# Patient Record
Sex: Female | Born: 1940 | Race: Black or African American | Hispanic: No | Smoking: Never smoker
Health system: Southern US, Community
[De-identification: ages and names within clinical notes are randomized; demographics above are authoritative.]

## PROBLEM LIST (undated history)

## (undated) DIAGNOSIS — K449 Diaphragmatic hernia without obstruction or gangrene: Secondary | ICD-10-CM

## (undated) DIAGNOSIS — I2510 Atherosclerotic heart disease of native coronary artery without angina pectoris: Secondary | ICD-10-CM

## (undated) DIAGNOSIS — N189 Chronic kidney disease, unspecified: Secondary | ICD-10-CM

## (undated) DIAGNOSIS — M109 Gout, unspecified: Secondary | ICD-10-CM

## (undated) DIAGNOSIS — I10 Essential (primary) hypertension: Secondary | ICD-10-CM

## (undated) DIAGNOSIS — M1990 Unspecified osteoarthritis, unspecified site: Secondary | ICD-10-CM

## (undated) HISTORY — DX: Chronic kidney disease, unspecified: N18.9

## (undated) HISTORY — PX: CHOLECYSTECTOMY: SHX55

## (undated) HISTORY — PX: CARDIAC CATHETERIZATION: SHX172

## (undated) HISTORY — PX: APPENDECTOMY: SHX54

## (undated) HISTORY — PX: ABDOMINAL HYSTERECTOMY: SHX81

## (undated) HISTORY — PX: FRACTURE SURGERY: SHX138

---

## 1998-02-25 ENCOUNTER — Emergency Department (HOSPITAL_COMMUNITY): Admission: EM | Admit: 1998-02-25 | Discharge: 1998-02-25 | Payer: Self-pay | Admitting: Emergency Medicine

## 1999-06-19 ENCOUNTER — Ambulatory Visit (HOSPITAL_COMMUNITY): Admission: RE | Admit: 1999-06-19 | Discharge: 1999-06-19 | Payer: Self-pay | Admitting: Cardiology

## 1999-06-19 ENCOUNTER — Encounter: Payer: Self-pay | Admitting: Cardiology

## 1999-11-13 ENCOUNTER — Encounter: Payer: Self-pay | Admitting: Emergency Medicine

## 1999-11-13 ENCOUNTER — Inpatient Hospital Stay (HOSPITAL_COMMUNITY): Admission: EM | Admit: 1999-11-13 | Discharge: 1999-11-14 | Payer: Self-pay | Admitting: Emergency Medicine

## 1999-11-13 ENCOUNTER — Encounter: Payer: Self-pay | Admitting: Cardiovascular Disease

## 2000-07-10 ENCOUNTER — Encounter: Payer: Self-pay | Admitting: Cardiology

## 2000-07-10 ENCOUNTER — Ambulatory Visit (HOSPITAL_COMMUNITY): Admission: RE | Admit: 2000-07-10 | Discharge: 2000-07-10 | Payer: Self-pay | Admitting: Cardiology

## 2001-08-03 ENCOUNTER — Ambulatory Visit (HOSPITAL_COMMUNITY): Admission: RE | Admit: 2001-08-03 | Discharge: 2001-08-04 | Payer: Self-pay | Admitting: Cardiology

## 2001-08-04 ENCOUNTER — Encounter: Payer: Self-pay | Admitting: Cardiology

## 2001-08-06 ENCOUNTER — Encounter: Payer: Self-pay | Admitting: Cardiology

## 2001-08-06 ENCOUNTER — Ambulatory Visit (HOSPITAL_COMMUNITY): Admission: RE | Admit: 2001-08-06 | Discharge: 2001-08-06 | Payer: Self-pay | Admitting: Cardiology

## 2001-09-29 ENCOUNTER — Inpatient Hospital Stay (HOSPITAL_COMMUNITY): Admission: EM | Admit: 2001-09-29 | Discharge: 2001-09-30 | Payer: Self-pay

## 2002-02-10 ENCOUNTER — Encounter: Payer: Self-pay | Admitting: Cardiology

## 2002-02-10 ENCOUNTER — Ambulatory Visit (HOSPITAL_COMMUNITY): Admission: RE | Admit: 2002-02-10 | Discharge: 2002-02-10 | Payer: Self-pay | Admitting: Cardiology

## 2002-02-11 ENCOUNTER — Encounter: Payer: Self-pay | Admitting: Cardiology

## 2002-02-15 ENCOUNTER — Encounter (HOSPITAL_BASED_OUTPATIENT_CLINIC_OR_DEPARTMENT_OTHER): Payer: Self-pay | Admitting: General Surgery

## 2002-02-17 ENCOUNTER — Encounter (INDEPENDENT_AMBULATORY_CARE_PROVIDER_SITE_OTHER): Payer: Self-pay | Admitting: Specialist

## 2002-02-17 ENCOUNTER — Ambulatory Visit (HOSPITAL_COMMUNITY): Admission: RE | Admit: 2002-02-17 | Discharge: 2002-02-17 | Payer: Self-pay | Admitting: General Surgery

## 2004-04-20 ENCOUNTER — Encounter: Admission: RE | Admit: 2004-04-20 | Discharge: 2004-04-20 | Payer: Self-pay | Admitting: Cardiology

## 2004-06-20 ENCOUNTER — Emergency Department (HOSPITAL_COMMUNITY): Admission: EM | Admit: 2004-06-20 | Discharge: 2004-06-20 | Payer: Self-pay | Admitting: Family Medicine

## 2005-04-02 ENCOUNTER — Emergency Department (HOSPITAL_COMMUNITY): Admission: EM | Admit: 2005-04-02 | Discharge: 2005-04-02 | Payer: Self-pay | Admitting: Family Medicine

## 2007-01-28 ENCOUNTER — Inpatient Hospital Stay (HOSPITAL_COMMUNITY): Admission: EM | Admit: 2007-01-28 | Discharge: 2007-01-30 | Payer: Self-pay | Admitting: Emergency Medicine

## 2007-01-31 ENCOUNTER — Inpatient Hospital Stay (HOSPITAL_COMMUNITY): Admission: EM | Admit: 2007-01-31 | Discharge: 2007-02-06 | Payer: Self-pay | Admitting: Emergency Medicine

## 2007-02-04 ENCOUNTER — Ambulatory Visit: Payer: Self-pay | Admitting: Physical Medicine & Rehabilitation

## 2007-02-06 ENCOUNTER — Inpatient Hospital Stay (HOSPITAL_COMMUNITY)
Admission: RE | Admit: 2007-02-06 | Discharge: 2007-02-16 | Payer: Self-pay | Admitting: Physical Medicine & Rehabilitation

## 2007-03-09 ENCOUNTER — Ambulatory Visit (HOSPITAL_COMMUNITY): Admission: RE | Admit: 2007-03-09 | Discharge: 2007-03-09 | Payer: Self-pay | Admitting: Cardiology

## 2007-04-02 ENCOUNTER — Encounter: Admission: RE | Admit: 2007-04-02 | Discharge: 2007-06-18 | Payer: Self-pay | Admitting: Orthopedic Surgery

## 2007-05-13 ENCOUNTER — Encounter: Admission: RE | Admit: 2007-05-13 | Discharge: 2007-08-05 | Payer: Self-pay | Admitting: General Surgery

## 2008-06-20 ENCOUNTER — Ambulatory Visit (HOSPITAL_COMMUNITY): Admission: RE | Admit: 2008-06-20 | Discharge: 2008-06-20 | Payer: Self-pay | Admitting: Cardiology

## 2008-09-30 ENCOUNTER — Emergency Department (HOSPITAL_COMMUNITY): Admission: EM | Admit: 2008-09-30 | Discharge: 2008-09-30 | Payer: Self-pay | Admitting: Emergency Medicine

## 2009-03-22 ENCOUNTER — Emergency Department (HOSPITAL_COMMUNITY): Admission: EM | Admit: 2009-03-22 | Discharge: 2009-03-22 | Payer: Self-pay | Admitting: Family Medicine

## 2009-09-17 ENCOUNTER — Emergency Department (HOSPITAL_COMMUNITY): Admission: EM | Admit: 2009-09-17 | Discharge: 2009-09-17 | Payer: Self-pay | Admitting: Family Medicine

## 2009-11-13 ENCOUNTER — Ambulatory Visit (HOSPITAL_COMMUNITY): Admission: RE | Admit: 2009-11-13 | Discharge: 2009-11-13 | Payer: Self-pay | Admitting: Cardiology

## 2009-12-07 ENCOUNTER — Ambulatory Visit (HOSPITAL_COMMUNITY): Admission: RE | Admit: 2009-12-07 | Discharge: 2009-12-07 | Payer: Self-pay | Admitting: Cardiology

## 2010-09-16 ENCOUNTER — Encounter: Payer: Self-pay | Admitting: Cardiology

## 2010-10-19 ENCOUNTER — Other Ambulatory Visit (HOSPITAL_COMMUNITY): Payer: Self-pay | Admitting: Cardiology

## 2010-10-19 DIAGNOSIS — I208 Other forms of angina pectoris: Secondary | ICD-10-CM

## 2010-10-27 ENCOUNTER — Emergency Department (HOSPITAL_COMMUNITY): Payer: Medicare Other

## 2010-10-27 ENCOUNTER — Inpatient Hospital Stay (HOSPITAL_COMMUNITY)
Admission: EM | Admit: 2010-10-27 | Discharge: 2010-10-31 | DRG: 313 | Disposition: A | Discharge status: Home Health | Payer: Medicare Other | Attending: Internal Medicine | Admitting: Internal Medicine

## 2010-10-27 DIAGNOSIS — I2510 Atherosclerotic heart disease of native coronary artery without angina pectoris: Secondary | ICD-10-CM | POA: Diagnosis present

## 2010-10-27 DIAGNOSIS — K219 Gastro-esophageal reflux disease without esophagitis: Secondary | ICD-10-CM | POA: Diagnosis present

## 2010-10-27 DIAGNOSIS — R0789 Other chest pain: Principal | ICD-10-CM | POA: Diagnosis present

## 2010-10-27 DIAGNOSIS — M1990 Unspecified osteoarthritis, unspecified site: Secondary | ICD-10-CM | POA: Diagnosis present

## 2010-10-27 DIAGNOSIS — Z79899 Other long term (current) drug therapy: Secondary | ICD-10-CM

## 2010-10-27 DIAGNOSIS — I10 Essential (primary) hypertension: Secondary | ICD-10-CM | POA: Diagnosis present

## 2010-10-27 DIAGNOSIS — I252 Old myocardial infarction: Secondary | ICD-10-CM

## 2010-10-27 DIAGNOSIS — E785 Hyperlipidemia, unspecified: Secondary | ICD-10-CM | POA: Diagnosis present

## 2010-10-27 DIAGNOSIS — Z7982 Long term (current) use of aspirin: Secondary | ICD-10-CM

## 2010-10-27 DIAGNOSIS — Z8249 Family history of ischemic heart disease and other diseases of the circulatory system: Secondary | ICD-10-CM

## 2010-10-27 DIAGNOSIS — F341 Dysthymic disorder: Secondary | ICD-10-CM | POA: Diagnosis present

## 2010-10-27 DIAGNOSIS — E669 Obesity, unspecified: Secondary | ICD-10-CM | POA: Diagnosis present

## 2010-10-27 DIAGNOSIS — M109 Gout, unspecified: Secondary | ICD-10-CM | POA: Diagnosis present

## 2010-10-27 LAB — LIPID PANEL
Cholesterol: 175 mg/dL (ref 0–200)
LDL Cholesterol: 106 mg/dL — ABNORMAL HIGH (ref 0–99)
Triglycerides: 73 mg/dL (ref <150)

## 2010-10-27 LAB — CBC
HCT: 37 % (ref 36.0–46.0)
HCT: 35.7 % — ABNORMAL LOW (ref 36.0–46.0)
Hemoglobin: 12.4 g/dL (ref 12.0–15.0)
MCV: 87.3 fL (ref 78.0–100.0)
Platelets: 220 10*3/uL (ref 150–400)
RBC: 4.24 MIL/uL (ref 3.87–5.11)
RBC: 4.1 MIL/uL (ref 3.87–5.11)
RDW: 15.5 % (ref 11.5–15.5)
RDW: 15.5 % (ref 11.5–15.5)
WBC: 7.6 10*3/uL (ref 4.0–10.5)
WBC: 7.8 10*3/uL (ref 4.0–10.5)

## 2010-10-27 LAB — HEMOGLOBIN A1C
Hgb A1c MFr Bld: 5.9 % — ABNORMAL HIGH (ref <5.7)
Mean Plasma Glucose: 123 mg/dL — ABNORMAL HIGH (ref <117)

## 2010-10-27 LAB — CARDIAC PANEL(CRET KIN+CKTOT+MB+TROPI)
CK, MB: 4.9 ng/mL — ABNORMAL HIGH (ref 0.3–4.0)
Relative Index: 1 (ref 0.0–2.5)
Total CK: 471 U/L — ABNORMAL HIGH (ref 7–177)

## 2010-10-27 LAB — BASIC METABOLIC PANEL
Calcium: 9.4 mg/dL (ref 8.4–10.5)
GFR calc non Af Amer: 51 mL/min — ABNORMAL LOW (ref >60)
Glucose, Bld: 127 mg/dL — ABNORMAL HIGH (ref 70–99)
Sodium: 141 mEq/L (ref 135–145)

## 2010-10-27 LAB — POCT CARDIAC MARKERS
CKMB, poc: 1.8 ng/mL (ref 1.0–8.0)
Myoglobin, poc: 233 ng/mL (ref 12–200)
Troponin i, poc: <0.05 ng/mL (ref 0.00–0.09)

## 2010-10-27 LAB — DIFFERENTIAL
Basophils Absolute: 0 10*3/uL (ref 0.0–0.1)
Basophils Absolute: 0 10*3/uL (ref 0.0–0.1)
Eosinophils Absolute: 0.2 10*3/uL (ref 0.0–0.7)
Eosinophils Relative: 3 % (ref 0–5)
Eosinophils Relative: 2 % (ref 0–5)
Lymphocytes Relative: 43 % (ref 12–46)
Lymphocytes Relative: 45 % (ref 12–46)
Lymphs Abs: 3.3 10*3/uL (ref 0.7–4.0)
Neutro Abs: 3.5 10*3/uL (ref 1.7–7.7)
Neutrophils Relative %: 46 % (ref 43–77)
Neutrophils Relative %: 46 % (ref 43–77)

## 2010-10-27 LAB — CK TOTAL AND CKMB (NOT AT ARMC)
CK, MB: 4.8 ng/mL — ABNORMAL HIGH (ref 0.3–4.0)
Relative Index: 0.9 (ref 0.0–2.5)

## 2010-10-27 LAB — COMPREHENSIVE METABOLIC PANEL
Albumin: 3.6 g/dL (ref 3.5–5.2)
BUN: 21 mg/dL (ref 6–23)
Creatinine, Ser: 1.07 mg/dL (ref 0.4–1.2)
Total Protein: 6.9 g/dL (ref 6.0–8.3)

## 2010-10-27 LAB — TROPONIN I: Troponin I: 0.01 ng/mL (ref 0.00–0.06)

## 2010-10-27 LAB — PHOSPHORUS: Phosphorus: 3.8 mg/dL (ref 2.3–4.6)
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
## 2010-10-29 LAB — BASIC METABOLIC PANEL
BUN: 17 mg/dL (ref 6–23)
CO2: 28 mEq/L (ref 19–32)
Chloride: 108 mEq/L (ref 96–112)
Creatinine, Ser: 1.05 mg/dL (ref 0.4–1.2)

## 2010-10-29 LAB — CBC
Hemoglobin: 12 g/dL (ref 12.0–15.0)
MCH: 28.6 pg (ref 26.0–34.0)
MCV: 86.9 fL (ref 78.0–100.0)
RBC: 4.19 MIL/uL (ref 3.87–5.11)

## 2010-10-30 ENCOUNTER — Inpatient Hospital Stay (HOSPITAL_COMMUNITY): Payer: Medicare Other

## 2010-10-30 DIAGNOSIS — R079 Chest pain, unspecified: Secondary | ICD-10-CM

## 2010-10-30 MED ORDER — TECHNETIUM TC 99M TETROFOSMIN IV KIT
10.0000 | PACK | Freq: Once | INTRAVENOUS | Status: AC | PRN
  Administered 2010-10-30: 10 via INTRAVENOUS

## 2010-10-30 MED ORDER — TECHNETIUM TC 99M TETROFOSMIN IV KIT
30.0000 | PACK | Freq: Once | INTRAVENOUS | Status: AC | PRN
  Administered 2010-10-30: 30 via INTRAVENOUS

## 2010-10-31 ENCOUNTER — Inpatient Hospital Stay (HOSPITAL_COMMUNITY): Payer: Medicare Other

## 2010-11-12 ENCOUNTER — Other Ambulatory Visit (HOSPITAL_COMMUNITY): Payer: Self-pay

## 2010-11-19 ENCOUNTER — Other Ambulatory Visit (HOSPITAL_COMMUNITY): Payer: Self-pay | Admitting: Cardiology

## 2010-11-19 DIAGNOSIS — Z1231 Encounter for screening mammogram for malignant neoplasm of breast: Secondary | ICD-10-CM

## 2010-11-27 ENCOUNTER — Ambulatory Visit (HOSPITAL_COMMUNITY)
Admission: RE | Admit: 2010-11-27 | Discharge: 2010-11-27 | Disposition: A | Discharge status: Home / Self Care | Payer: Medicare Other | Source: Ambulatory Visit | Attending: Cardiology | Admitting: Cardiology

## 2010-11-27 DIAGNOSIS — Z1231 Encounter for screening mammogram for malignant neoplasm of breast: Secondary | ICD-10-CM | POA: Insufficient documentation

## 2010-12-02 LAB — DIFFERENTIAL
Basophils Relative: 1 % (ref 0–1)
Eosinophils Absolute: 0.1 10*3/uL (ref 0.0–0.7)
Eosinophils Relative: 3 % (ref 0–5)
Monocytes Relative: 9 % (ref 3–12)
Neutrophils Relative %: 50 % (ref 43–77)

## 2010-12-02 LAB — CBC
HCT: 35.6 % — ABNORMAL LOW (ref 36.0–46.0)
MCHC: 33.3 g/dL (ref 30.0–36.0)
MCV: 89.6 fL (ref 78.0–100.0)
RBC: 3.97 MIL/uL (ref 3.87–5.11)

## 2010-12-11 LAB — BASIC METABOLIC PANEL
BUN: 17 mg/dL (ref 6–23)
CO2: 25 mEq/L (ref 19–32)
Calcium: 9.4 mg/dL (ref 8.4–10.5)
Creatinine, Ser: 0.99 mg/dL (ref 0.4–1.2)
GFR calc Af Amer: >60 mL/min (ref >60)
Glucose, Bld: 101 mg/dL — ABNORMAL HIGH (ref 70–99)

## 2010-12-11 LAB — POCT CARDIAC MARKERS: Myoglobin, poc: 323 ng/mL (ref 12–200)

## 2010-12-11 LAB — CBC
MCHC: 33.2 g/dL (ref 30.0–36.0)
MCV: 89.2 fL (ref 78.0–100.0)
Platelets: 171 10*3/uL (ref 150–400)
RBC: 4.5 MIL/uL (ref 3.87–5.11)
RDW: 14.8 % (ref 11.5–15.5)
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
## 2011-01-08 NOTE — Discharge Summary (Signed)
NAMEJODEY, Shelby Meza              ACCOUNT NO.:  [id number]   MEDICAL RECORD NO.:  [id number]          PATIENT TYPE:  INP   LOCATION:  [id number]                         FACILITY:  [facility]   PHYSICIAN:  Mohan N. Terrence Dupont, M.D. DATE OF BIRTH:  1941-07-11   DATE OF ADMISSION:  01/28/2007  DATE OF DISCHARGE:  01/30/2007                               DISCHARGE SUMMARY   ADMITTING DIAGNOSES:  1. Chest pain.  Rule out myocardial infarction.  2. Hypertension.  3. Degenerative joint disease.  4. Morbid obesity.  5. Gastroesophageal reflux disease.  6. Hypercholesterolemia.   FINAL DIAGNOSES:  1. Status post chest pain.  Myocardial infarction ruled out.  Negative      Persantine Myoview.  2. Hypertension.  3. Hypercholesterolemia.  4. Gastroesophageal reflux disease.  5. Morbid obesity.  6. Degenerative joint disease.  7. Questionable __________ .  8. Anemia, stable.   DISCHARGE HOME MEDICATIONS:  Enteric coated aspirin 81 mg one tablet  daily, Norvasc 5 mg one tablet daily, __________  tablet daily at night,  Prilosec 20 mg one tablet daily.   DIET:  Low salt, low cholesterol.   ACTIVITY:  As tolerated.   CONDITION ON DISCHARGE:  Stable.   FOLLOW UP:  Follow up with me in one week.  Patient will be scheduled  for bilateral mammograms in one week as an outpatient.   BRIEF HISTORY:  Positive for __________ .  Denies any shortness of  breath.  Denies palpitations.  Denies __________.  Denies cough or sore  throat.   PAST MEDICAL HISTORY:  As above.   PAST SURGICAL HISTORY:  __________ .   ALLERGIES:  NO KNOWN DRUG ALLERGIES.   __________   LABORATORY DATA:  At the point of care, CK-MB were normal at 2.3 and  2.8, troponin I was 0.06 and 0.05.  Three sets of cardiac enzymes were  normal.  CK 558, MB 3.8.  Second set of CK 560, MB 4.4.  Third set CK  476, MB 3.6.  Troponin I was 0.02 x3.  Sodium was 141, potassium 3.8,  chloride 108, bicarb 27, glucose 97, BUN 35, creatinine 1.   Hemoglobin  was 10.7, hematocrit 37.9, white count of 5.6.   BRIEF HOSPITAL COURSE:  The patient was admitted to telemetry unit.  MI  was ruled out with serial enzymes and EKG.  Patient did not have any  episodes of chest pain during her hospital stay.  Patient subsequently  underwent Persantine Myoview which showed no evidence of reversible  ischemia with an EF of 50%.  There was questionable increased activity  in the right breast tissue.  Patient states she had mammograms in the  past, which were normal.  We will check for old records and repeat the  mammogram as an outpatient as indicated.  The patient will be discharged  home on the above medications and will be followed up in my office in  one week.           ______________________________  Allegra Lai. Terrence Dupont, M.D.     MNH/MEDQ  D:  01/30/2007  T:  01/30/2007  Job:  [id number] 

## 2011-01-11 NOTE — Op Note (Signed)
[place]. [facility]  Patient:    Shelby Meza, Shelby Meza Visit Number: [id number] MRN: [id number]          Service Type: DSU Location: [place] 2899 22 Attending Physician:  Sherolyn Buba Dictated by:   Candee Furbish Bubba Camp, M.D. Proc. Date: 02/17/02 Admit Date:  02/17/2002 Discharge Date: 02/17/2002                             Operative Report  PREOPERATIVE DIAGNOSIS:  Large epidermoid inclusion cyst of back.  POSTOPERATIVE DIAGNOSIS:  Large epidermoid inclusion cyst of back.  PROCEDURE:  Excision of epidermoid inclusion cyst of back.  SURGEON:  Candee Furbish. Bubba Camp, M.D.  ASSESSMENT:  Nurse.  ANESTHESIA:  General.  CLINICAL NOTE:  The patient presents with a very large, approximately 8 cm sebaceous cyst in the upper back subscapular region just to the right paraspinal area.  This has been infected on and off, and she now desires excision.  She comes to the operating room after the risks and benefits of surgery have been fully discussed, all questions answered, and consent obtained.  DESCRIPTION OF PROCEDURE:  Following the induction of satisfactory general anesthesia, with the patient positioned supinely, the patient was then turned into the prone position and positioned for surgery.  The region of the back was prepped and draped to be included in the sterile operative field.  I made a very large elliptical incision, extending approximately 12 cm in total length, deepening this through the skin and subcutaneous tissues, dissecting around the sebaceous cyst, carrying the dissection down to the muscles of the back.  The cyst then dissected free from the muscles and removed in its entirety and forwarded for pathologic evaluation.  Hemostasis was then meticulously obtained with electrocautery.  Sponge, instrument, and sharp counts verified.  Lateral flaps were raised so as to effect a tension-free closure.  The subcutaneous tissue was then closed with  interrupted and running sutures of 4-0 Vicryl.  The skin was closed with a 4-0 running Monocryl and then reinforced with Steri-Strips and a sterile compressive dressing applied. Anesthetic reversed, the patient removed from the operating room to the recovery room in stable condition.  She tolerated the procedure well. Dictated by:   Candee Furbish. Bubba Camp, M.D. Attending Physician:  Sherolyn Buba DD:  02/17/02 TD:  02/18/02 Job: [id number]

## 2011-01-11 NOTE — Cardiovascular Report (Signed)
[place]. [facility]  Patient:    Shelby Meza, Shelby Meza                     MRN: [id number] Proc. Date: 11/13/99 Adm. Date:  [id number] Attending:  Birdie Riddle CC:         Ardyth Gal. Spruill, M.D.             Cardiac Catheterization Laboratory                        Cardiac Catheterization  CINE NUMBER:  [id number]  PROCEDURE:  Left heart catheterization, selective coronary angiography, left ventricular function study, and descending aortography.  INDICATIONS:  This 69 year old black female had typical angina with elevated cardiac enzymes and cardiac risk factor of hypertension and a strong family history of coronary artery disease.  APPROACH:  Right femoral artery using 6 French diagnostic catheters.  A Perclose suture was applied at the end of the procedure.  COMPLICATIONS:  None.  HEMODYNAMIC DATA:  The left ventricular pressure was 131/15 mmHg and the aortic  pressure was 132/78 mmHg  LEFT VENTRICULOGRAM:  The left ventriculogram showed normal left ventricular systolic function with ejection fraction of 70%.  DESCENDING AORTOGRAPHY:  The descending aortography showed mild tortuosity of common iliac vessels and two left renal arteries and one right renal artery otherwise was unremarkable.  CORONARY ANATOMY:  Left main:  The left main coronary artery was essentially unremarkable.  Left left anterior descending:  Left anterior descending coronary artery was also unremarkable.  Left circumflex:  The left circumflex coronary artery was a large vessel and was codominant with right coronary artery.  The obtuse marginal branch was a large vessel.  Right coronary artery:  The right coronary artery was also a large vessel and was essentially unremarkable.  IMPRESSION: 1. Normal coronaries. 2. Normal left ventricular systolic function. 3. Mild tortuosity of distal aorta and left common iliac vessel.  RECOMMENDATIONS:  This patient will undergo  noncardiac chest pain work-up. DD:  11/13/99 TD:  11/13/99 Job: [id number]

## 2011-01-11 NOTE — Discharge Summary (Signed)
[place]. [facility]  Patient:    Shelby Meza, Shelby Meza                     MRN: [id number] Adm. Date:  [id number] Disc. Date: 11/14/99 Attending:  Birdie Riddle CC:         Ardyth Gal. Spruill, M.D.                           Discharge Summary  PRINCIPAL DIAGNOSES:  1. Chest pain.  2. Hiatal hernia.  3. Reflux esophagitis.  4. Hypertension.  5. Obesity.  PRINCIPAL PROCEDURE:  1. Left heart catheterization, selective coronary angiography, left left renal     function study and descending aortography done by Dr. Dixie Dials on November 13, 1999.  2. Upper GI series with radium swallow revealing small hiatal hernia and reflux     esophagitis done on November 13, 1999.  DISCHARGE MEDICATIONS:  1. ___________ recess program medication 2 capsules in the morning.  2. Celebrex 200 mg in the morning.  3. Doxepin 10-20 mg at bedtime.  4. Darvocet-N 100 one 4 times daily as needed.  5. Flexeril 10 mg twice daily.  6. Prevacid 30 mg 1 daily.  DISCHARGE ACTIVITY:  As tolerated.  DISCHARGE DIET:  Low fat, low salt, 1400 calorie diet.  WOUND CARE INSTRUCTIONS:  The patient is to notify doctor if she has right groin pain, swelling, or discharge.  FOLLOWUP:  Follow-up appointment by Dr. Dixie Dials in two weeks.  The patient to call [phone] for appointment.  Dr. Awanda Mink Spruill in four weeks.  The patient o arrange that also.  HISTORY:  This 70 year old black female had typical angina with elevated CK and MB. History of hypertension for 5 years, obesity, and a family history of premature  coronary artery disease with myocardial infarction to father at age 70.  PHYSICAL EXAMINATION:  VITAL SIGNS:  Temperature 97, pulse 80, respirations 16, blood pressure 132/80.  Height 5 feet 5 inches.  Weight 265 pounds.  GENERAL:  The patient is alert and oriented x 3.  HEENT:  The head is normocephalic, atraumatic with gray and black hair. Eyes:  Brown eyes with  pupils equal and reacting to light.  Extraocular movements are intact.  Conjunctivae pink, sclerae nonicteric.  Ears, nose and throat: Mucous membranes pink and moist.  NECK:  No JVD.  No carotid bruit.  LUNGS:  Clear bilaterally.  HEART:  Normal S1 and S2.  ABDOMEN:  Soft and nontender, but distended.  EXTREMITIES:  No edema, cyanosis, or clubbing.  CENTRAL NERVOUS SYSTEM:  Cranial nerves II-XII grossly intact.  LABORATORY DATA:  Normal hemoglobin, hematocrit, WBC count and platelet count.  Normal electrolytes, BUN, and creatinine.  CK elevated at 679.  MB elevated at .8. Troponin I negative.  EKG with sinus rhythm.  Left heart catheterization with selective coronary angiography and left renal function study and descending aortography that showed normal coronaries and normal LV systolic function.  Mild tortuosity of the distal aorta.  Barium swallow with upper GI series revealed a small hiatal hernia with reflux esophagitis.  HOSPITAL COURSE:  The patient was admitted to telemetry unit with elevated CK-MBs and total CK, and typical chest pain present.  Underwent cardiac catheterization, however, this showed normal coronaries.  Hence, the patient underwent upper GI series studies revealing hiatal hernia and reflux esophagitis.  Heart condition  remained stable in hospital and  she was placed on Prevacid 30 mg one daily. She was discharged in satisfactory condition with followup by Dr. Wallene Huh in  four weeks and by me in two weeks. DD:  11/14/99 TD:  11/14/99 Job: [id number]

## 2011-01-11 NOTE — Discharge Summary (Signed)
[place]. [facility]  Patient:    Shelby Meza, Shelby Meza Visit Number: [id number] MRN: [id number]          Service Type: MED Location: 2000 2028 01 Attending Physician:  Patricia Nettle Dictated by:   Evorn Gong. Lawanda Cousins. Admit Date:  09/29/2001 Discharge Date: 09/30/2001                             Discharge Summary  DISCHARGE DIAGNOSES: 1. Esophageal reflux. 2. Hypertension. 3. Obesity.  HISTORY:  This is a 70 year old patient, who presented initially to the emergency department of [facility] on September 29, 2001, with a chief complaint of dull chest pain in the substernal area associated only with headache.  It is relieved by sitting up, comes on during rest.  It is of note that this patient has had an MI in the past which brought about her significant concern, and she came to the emergency department for further evaluation.  While in the emergency department, she was monitored very closely, had a heart rate of 72.  She was found on electrocardiogram to have a normal sinus rhythm with no acute changes found.  She had chemistries done that were well within normal limits.  She had a complete blood count that was also well within normal limits.  Her pulse oximetry was 97% on room air.  Given the patients history and the amount of pain and discomfort, it was the opinion that she should be admitted for further evaluation, especially considering the CK was slightly elevated.  Therefore, the patient was admitted to the 24-hour observation status.  HOSPITAL COURSE:  The patient was admitted to the medical service, was placed on IV fluids, had serial troponins and serial cardiac enzymes ordered as well as serial electrocardiograms.  The electrocardiograms revealed sinus bradycardia with left axis deviation and no specific ST-T wave changes.  The patient was treated with Lovenox during the course of the work-up.  The patient responded nicely to the  Lopressor and Protonix medication.  On September 30, 2001, it was the opinion that the patient was stable.  Her monitor observations were stable, and it was the opinion that she probably had reflux esophagitis as the cause of her pain at this time.  The patient was subsequently discharged.  DISPOSITION: 1. She is to continue her current medications. 2. She is to see Dr. Montez Morita in the office of follow-up.  DISCHARGE MEDICATIONS: 1. Protonix 40 mg q.d. 2. Lopressor 50 mg q.d. 3. Norvasc 5 mg q.d. 4. Vioxx 25 mg q.d. 5. Aspirin 81 q.d. 6. Restoril 30 mg q.h.s.  FOLLOW-UP:  She is to be seen in the office in one week, sooner if any changes, problems, or concerns. Dictated by:   Evorn Gong. Lawanda Cousins. Attending Physician:  Patricia Nettle DD:  11/03/01 TD:  11/05/01 Job: [id number]

## 2011-06-12 LAB — CBC
HCT: 29.1 — ABNORMAL LOW
Hemoglobin: 9.7 — ABNORMAL LOW
MCV: 89.3
Platelets: 272
RDW: 14.5 — ABNORMAL HIGH

## 2011-06-12 LAB — URINALYSIS, ROUTINE W REFLEX MICROSCOPIC
Bilirubin Urine: NEGATIVE
Glucose, UA: NEGATIVE
Hgb urine dipstick: NEGATIVE
Specific Gravity, Urine: 1.018
pH: 6

## 2011-06-12 LAB — COMPREHENSIVE METABOLIC PANEL
Albumin: 2.2 — ABNORMAL LOW
Alkaline Phosphatase: 70
BUN: 15
Chloride: 100
Creatinine, Ser: 0.96
Glucose, Bld: 105 — ABNORMAL HIGH
Potassium: 4
Total Bilirubin: 0.7

## 2011-06-12 LAB — PROTIME-INR
INR: 2.6 — ABNORMAL HIGH
INR: 3.1 — ABNORMAL HIGH
INR: 3.8 — ABNORMAL HIGH
Prothrombin Time: 29.6 — ABNORMAL HIGH
Prothrombin Time: 33.7 — ABNORMAL HIGH
Prothrombin Time: 34.2 — ABNORMAL HIGH

## 2011-06-12 LAB — URINE MICROSCOPIC-ADD ON

## 2011-06-12 LAB — DIFFERENTIAL
Basophils Absolute: 0.1
Basophils Relative: 1
Lymphocytes Relative: 14
Neutro Abs: 8.6 — ABNORMAL HIGH
Neutrophils Relative %: 73

## 2011-06-12 LAB — URINE CULTURE
Colony Count: >=100000
Special Requests: NEGATIVE

## 2011-06-13 LAB — CBC
HCT: 26.5 — ABNORMAL LOW
HCT: 34.5 — ABNORMAL LOW
HCT: 37.9
HCT: 37.9
Hemoglobin: 11.1 — ABNORMAL LOW
Hemoglobin: 12.5
Hemoglobin: 12.7
Hemoglobin: 12.7
Hemoglobin: 9.6 — ABNORMAL LOW
MCHC: 32.9
MCHC: 33.3
MCHC: 33.3
MCHC: 33.4
MCHC: 34.5
MCV: 86.5
MCV: 86.6
MCV: 86.9
MCV: 88.1
MCV: 88.7
MCV: 89.1
Platelets: 149 — ABNORMAL LOW
Platelets: 154
Platelets: 176
Platelets: 178
Platelets: 195
Platelets: 213
RBC: 2.75 — ABNORMAL LOW
RBC: 3.09 — ABNORMAL LOW
RBC: 3.77 — ABNORMAL LOW
RBC: 3.8 — ABNORMAL LOW
RBC: 3.97
RBC: 3.98
RBC: 4.31
RBC: 4.37
RDW: 14.1 — ABNORMAL HIGH
RDW: 14.1 — ABNORMAL HIGH
RDW: 14.2 — ABNORMAL HIGH
RDW: 14.3 — ABNORMAL HIGH
RDW: 14.5 — ABNORMAL HIGH
RDW: 14.6 — ABNORMAL HIGH
WBC: 11 — ABNORMAL HIGH
WBC: 11.9 — ABNORMAL HIGH
WBC: 12.1 — ABNORMAL HIGH
WBC: 12.5 — ABNORMAL HIGH
WBC: 8.1
WBC: 8.3
WBC: 9.4

## 2011-06-13 LAB — HEPARIN LEVEL (UNFRACTIONATED)
Heparin Unfractionated: 0.56
Heparin Unfractionated: 0.63
Heparin Unfractionated: <0.1 — ABNORMAL LOW

## 2011-06-13 LAB — COMPREHENSIVE METABOLIC PANEL
ALT: 20
ALT: 42 — ABNORMAL HIGH
Alkaline Phosphatase: 41
Alkaline Phosphatase: 43
CO2: 27
Chloride: 106
Chloride: 109
Glucose, Bld: 130 — ABNORMAL HIGH
Glucose, Bld: 90
Potassium: 3.6
Potassium: 4
Sodium: 142
Sodium: 143
Total Bilirubin: 0.5
Total Bilirubin: 0.8
Total Protein: 7.1
Total Protein: 7.2

## 2011-06-13 LAB — CROSSMATCH
ABO/RH(D): A POS
ABO/RH(D): A POS
Antibody Screen: NEGATIVE
Antibody Screen: NEGATIVE

## 2011-06-13 LAB — DIFFERENTIAL
Basophils Relative: 0
Eosinophils Absolute: 0
Eosinophils Absolute: 0.1
Eosinophils Relative: 2
Lymphocytes Relative: 49 — ABNORMAL HIGH
Lymphs Abs: 2.7
Monocytes Absolute: 0.4
Monocytes Absolute: 0.6
Monocytes Relative: 4
Neutrophils Relative %: 83 — ABNORMAL HIGH

## 2011-06-13 LAB — BASIC METABOLIC PANEL
BUN: 12
BUN: 13
BUN: 22
CO2: 26
CO2: 27
CO2: 27
Calcium: 8.2 — ABNORMAL LOW
Calcium: 8.6
Calcium: 8.6
Calcium: 8.7
Chloride: 105
Chloride: 105
Chloride: 106
Chloride: 107
Chloride: 108
Creatinine, Ser: 1.01
Creatinine, Ser: 1.02
Creatinine, Ser: 1.19
Creatinine, Ser: 1.68 — ABNORMAL HIGH
GFR calc Af Amer: 55 — ABNORMAL LOW
GFR calc Af Amer: 55 — ABNORMAL LOW
GFR calc Af Amer: >60
GFR calc Af Amer: >60
GFR calc Af Amer: >60
GFR calc non Af Amer: 47 — ABNORMAL LOW
Potassium: 3.6
Potassium: 4.2
Sodium: 136
Sodium: 136
Sodium: 139
Sodium: 141
Sodium: 143

## 2011-06-13 LAB — POCT I-STAT 7, (LYTES, BLD GAS, ICA,H+H)
Acid-base deficit: 1
Bicarbonate: 24.8 — ABNORMAL HIGH
Hemoglobin: 8.8 — ABNORMAL LOW
Patient temperature: 36.7
Sodium: 138
TCO2: 26
pH, Arterial: 7.351

## 2011-06-13 LAB — PROTIME-INR
INR: 1
INR: 1.2
INR: 1.3
INR: 1.6 — ABNORMAL HIGH
INR: 1.9 — ABNORMAL HIGH
Prothrombin Time: 13.5
Prothrombin Time: 15
Prothrombin Time: 19.6 — ABNORMAL HIGH
Prothrombin Time: 22.8 — ABNORMAL HIGH

## 2011-06-13 LAB — POCT CARDIAC MARKERS
CKMB, poc: 2.3
Myoglobin, poc: 249
Operator id: 234501
Troponin i, poc: <0.05

## 2011-06-13 LAB — CARDIAC PANEL(CRET KIN+CKTOT+MB+TROPI)
CK, MB: 3.6
Relative Index: 0.8
Total CK: 476 — ABNORMAL HIGH
Troponin I: 0.02

## 2011-06-13 LAB — PREPARE RBC (CROSSMATCH)

## 2011-06-13 LAB — URINALYSIS, ROUTINE W REFLEX MICROSCOPIC
Glucose, UA: NEGATIVE
Leukocytes, UA: NEGATIVE
Specific Gravity, Urine: 1.016
pH: 7

## 2011-06-13 LAB — LIPID PANEL
Cholesterol: 145
Triglycerides: 92

## 2011-06-13 LAB — POCT I-STAT CREATININE
Creatinine, Ser: 1.4 — ABNORMAL HIGH
Operator id: 279831

## 2011-06-13 LAB — CK TOTAL AND CKMB (NOT AT ARMC): CK, MB: 3.8

## 2011-06-13 LAB — URINE CULTURE: Culture: NO GROWTH

## 2011-06-13 LAB — I-STAT 8, (EC8 V) (CONVERTED LAB)
BUN: 25 — ABNORMAL HIGH
Bicarbonate: 28.1 — ABNORMAL HIGH
Chloride: 108
Operator id: 279831
pCO2, Ven: 40.6 — ABNORMAL LOW
pH, Ven: 7.448 — ABNORMAL HIGH

## 2011-06-13 LAB — HEMOGLOBIN AND HEMATOCRIT, BLOOD
HCT: 19.5 — ABNORMAL LOW
Hemoglobin: 6.6 — CL

## 2011-06-13 LAB — URINE MICROSCOPIC-ADD ON

## 2011-11-27 ENCOUNTER — Other Ambulatory Visit (HOSPITAL_COMMUNITY): Payer: Self-pay | Admitting: Cardiology

## 2011-11-27 DIAGNOSIS — Z1231 Encounter for screening mammogram for malignant neoplasm of breast: Secondary | ICD-10-CM

## 2011-12-26 ENCOUNTER — Ambulatory Visit (HOSPITAL_COMMUNITY)
Admission: RE | Admit: 2011-12-26 | Discharge: 2011-12-26 | Disposition: A | Discharge status: Home / Self Care | Payer: Medicare Other | Source: Ambulatory Visit | Attending: Cardiology | Admitting: Cardiology

## 2011-12-26 DIAGNOSIS — Z1231 Encounter for screening mammogram for malignant neoplasm of breast: Secondary | ICD-10-CM | POA: Insufficient documentation

## 2012-12-21 ENCOUNTER — Other Ambulatory Visit (HOSPITAL_COMMUNITY): Payer: Self-pay | Admitting: Cardiology

## 2012-12-21 DIAGNOSIS — Z1231 Encounter for screening mammogram for malignant neoplasm of breast: Secondary | ICD-10-CM

## 2012-12-28 ENCOUNTER — Ambulatory Visit (HOSPITAL_COMMUNITY)
Admission: RE | Admit: 2012-12-28 | Discharge: 2012-12-28 | Disposition: A | Discharge status: Home / Self Care | Payer: Medicare Other | Source: Ambulatory Visit | Attending: Cardiology | Admitting: Cardiology

## 2012-12-28 DIAGNOSIS — Z1231 Encounter for screening mammogram for malignant neoplasm of breast: Secondary | ICD-10-CM | POA: Insufficient documentation

## 2013-10-06 ENCOUNTER — Encounter (HOSPITAL_COMMUNITY): Payer: Self-pay | Admitting: Emergency Medicine

## 2013-10-06 DIAGNOSIS — M109 Gout, unspecified: Secondary | ICD-10-CM | POA: Diagnosis present

## 2013-10-06 DIAGNOSIS — Z6841 Body Mass Index (BMI) 40.0 and over, adult: Secondary | ICD-10-CM

## 2013-10-06 DIAGNOSIS — I498 Other specified cardiac arrhythmias: Secondary | ICD-10-CM | POA: Diagnosis present

## 2013-10-06 DIAGNOSIS — I200 Unstable angina: Secondary | ICD-10-CM | POA: Diagnosis present

## 2013-10-06 DIAGNOSIS — Z8249 Family history of ischemic heart disease and other diseases of the circulatory system: Secondary | ICD-10-CM

## 2013-10-06 DIAGNOSIS — I2510 Atherosclerotic heart disease of native coronary artery without angina pectoris: Principal | ICD-10-CM | POA: Diagnosis present

## 2013-10-06 DIAGNOSIS — I10 Essential (primary) hypertension: Secondary | ICD-10-CM | POA: Diagnosis present

## 2013-10-06 DIAGNOSIS — K219 Gastro-esophageal reflux disease without esophagitis: Secondary | ICD-10-CM | POA: Diagnosis present

## 2013-10-06 DIAGNOSIS — I252 Old myocardial infarction: Secondary | ICD-10-CM

## 2013-10-06 DIAGNOSIS — Z79899 Other long term (current) drug therapy: Secondary | ICD-10-CM

## 2013-10-06 DIAGNOSIS — Z7982 Long term (current) use of aspirin: Secondary | ICD-10-CM

## 2013-10-06 NOTE — ED Notes (Signed)
Pt. reports mid chest pain radiating to left arm and upper back onset Sunday this week , denies SOB , slight nausea , pt. has taken NTG sl this evening with slight relief.

## 2013-10-07 ENCOUNTER — Inpatient Hospital Stay (HOSPITAL_COMMUNITY)
Admission: EM | Admit: 2013-10-07 | Discharge: 2013-10-08 | DRG: 287 | Disposition: A | Discharge status: Home / Self Care | Payer: Medicare Other | Attending: Cardiology | Admitting: Cardiology

## 2013-10-07 ENCOUNTER — Encounter (HOSPITAL_COMMUNITY)
Admission: EM | Disposition: A | Discharge status: Home / Self Care | Payer: Self-pay | Source: Home / Self Care | Attending: Cardiology

## 2013-10-07 ENCOUNTER — Emergency Department (HOSPITAL_COMMUNITY): Payer: Medicare Other

## 2013-10-07 DIAGNOSIS — R079 Chest pain, unspecified: Secondary | ICD-10-CM | POA: Diagnosis present

## 2013-10-07 DIAGNOSIS — R001 Bradycardia, unspecified: Secondary | ICD-10-CM

## 2013-10-07 DIAGNOSIS — I200 Unstable angina: Secondary | ICD-10-CM | POA: Diagnosis present

## 2013-10-07 HISTORY — DX: Essential (primary) hypertension: I10

## 2013-10-07 HISTORY — DX: Unspecified osteoarthritis, unspecified site: M19.90

## 2013-10-07 HISTORY — PX: LEFT HEART CATHETERIZATION WITH CORONARY ANGIOGRAM: SHX5451

## 2013-10-07 HISTORY — DX: Atherosclerotic heart disease of native coronary artery without angina pectoris: I25.10

## 2013-10-07 LAB — BASIC METABOLIC PANEL
BUN: 28 mg/dL — AB (ref 6–23)
CHLORIDE: 104 meq/L (ref 96–112)
CO2: 21 meq/L (ref 19–32)
CREATININE: 0.96 mg/dL (ref 0.50–1.10)
Calcium: 9.5 mg/dL (ref 8.4–10.5)
GFR calc Af Amer: 66 mL/min — ABNORMAL LOW (ref >90)
GFR calc non Af Amer: 57 mL/min — ABNORMAL LOW (ref >90)
Glucose, Bld: 92 mg/dL (ref 70–99)
Potassium: 4.1 mEq/L (ref 3.7–5.3)
Sodium: 139 mEq/L (ref 137–147)

## 2013-10-07 LAB — CBC
HEMATOCRIT: 36.8 % (ref 36.0–46.0)
Hemoglobin: 12.5 g/dL (ref 12.0–15.0)
MCH: 29.6 pg (ref 26.0–34.0)
MCHC: 34 g/dL (ref 30.0–36.0)
MCV: 87.2 fL (ref 78.0–100.0)
Platelets: 219 10*3/uL (ref 150–400)
RBC: 4.22 MIL/uL (ref 3.87–5.11)
RDW: 15.9 % — ABNORMAL HIGH (ref 11.5–15.5)
WBC: 8.3 10*3/uL (ref 4.0–10.5)

## 2013-10-07 LAB — COMPREHENSIVE METABOLIC PANEL
ALT: 18 U/L (ref 0–35)
AST: 24 U/L (ref 0–37)
Albumin: 3.8 g/dL (ref 3.5–5.2)
Alkaline Phosphatase: 68 U/L (ref 39–117)
BUN: 19 mg/dL (ref 6–23)
CALCIUM: 9.7 mg/dL (ref 8.4–10.5)
CO2: 23 meq/L (ref 19–32)
CREATININE: 0.8 mg/dL (ref 0.50–1.10)
Chloride: 103 mEq/L (ref 96–112)
GFR, EST AFRICAN AMERICAN: 83 mL/min — AB (ref >90)
GFR, EST NON AFRICAN AMERICAN: 71 mL/min — AB (ref >90)
GLUCOSE: 114 mg/dL — AB (ref 70–99)
Potassium: 4.4 mEq/L (ref 3.7–5.3)
Sodium: 140 mEq/L (ref 137–147)
TOTAL PROTEIN: 7.4 g/dL (ref 6.0–8.3)
Total Bilirubin: 0.3 mg/dL (ref 0.3–1.2)

## 2013-10-07 LAB — CBC WITH DIFFERENTIAL/PLATELET
Basophils Absolute: 0 10*3/uL (ref 0.0–0.1)
Basophils Relative: 0 % (ref 0–1)
EOS ABS: 0.1 10*3/uL (ref 0.0–0.7)
EOS PCT: 1 % (ref 0–5)
HCT: 38 % (ref 36.0–46.0)
HEMOGLOBIN: 12.8 g/dL (ref 12.0–15.0)
Lymphocytes Relative: 35 % (ref 12–46)
Lymphs Abs: 2 10*3/uL (ref 0.7–4.0)
MCH: 29.4 pg (ref 26.0–34.0)
MCHC: 33.7 g/dL (ref 30.0–36.0)
MCV: 87.2 fL (ref 78.0–100.0)
MONOS PCT: 5 % (ref 3–12)
Monocytes Absolute: 0.3 10*3/uL (ref 0.1–1.0)
NEUTROS PCT: 60 % (ref 43–77)
Neutro Abs: 3.5 10*3/uL (ref 1.7–7.7)
PLATELETS: 222 10*3/uL (ref 150–400)
RBC: 4.36 MIL/uL (ref 3.87–5.11)
RDW: 15.8 % — ABNORMAL HIGH (ref 11.5–15.5)
WBC: 5.9 10*3/uL (ref 4.0–10.5)

## 2013-10-07 LAB — TSH: TSH: 1.217 u[IU]/mL (ref 0.350–4.500)

## 2013-10-07 LAB — TROPONIN I
Troponin I: <0.3 ng/mL (ref <0.30)
Troponin I: <0.3 ng/mL (ref <0.30)
Troponin I: <0.3 ng/mL (ref <0.30)
Troponin I: <0.3 ng/mL (ref <0.30)

## 2013-10-07 LAB — POCT I-STAT TROPONIN I: Troponin i, poc: 0.02 ng/mL (ref 0.00–0.08)

## 2013-10-07 LAB — PROTIME-INR
INR: 1.04 (ref 0.00–1.49)
Prothrombin Time: 13.4 seconds (ref 11.6–15.2)

## 2013-10-07 LAB — POCT ACTIVATED CLOTTING TIME: Activated Clotting Time: 127 seconds

## 2013-10-07 LAB — APTT: aPTT: 26 seconds (ref 24–37)

## 2013-10-07 LAB — HEPARIN LEVEL (UNFRACTIONATED): Heparin Unfractionated: <0.1 IU/mL — ABNORMAL LOW (ref 0.30–0.70)

## 2013-10-07 SURGERY — LEFT HEART CATHETERIZATION WITH CORONARY ANGIOGRAM
Anesthesia: LOCAL

## 2013-10-07 MED ORDER — ALLOPURINOL 300 MG PO TABS
300.0000 mg | ORAL_TABLET | Freq: Every day | ORAL | Status: DC
  Administered 2013-10-07 – 2013-10-08 (×2): 300 mg via ORAL
  Filled 2013-10-07 (×2): qty 1

## 2013-10-07 MED ORDER — SODIUM CHLORIDE 0.9 % IV SOLN: INTRAVENOUS | Status: DC

## 2013-10-07 MED ORDER — VITAMIN B-12 1000 MCG PO TABS
1000.0000 ug | ORAL_TABLET | Freq: Every day | ORAL | Status: DC
  Administered 2013-10-07 – 2013-10-08 (×2): 1000 ug via ORAL
  Filled 2013-10-07 (×2): qty 1

## 2013-10-07 MED ORDER — FENTANYL CITRATE 0.05 MG/ML IJ SOLN
INTRAMUSCULAR | Status: AC
  Filled 2013-10-07: qty 2

## 2013-10-07 MED ORDER — PANTOPRAZOLE SODIUM 40 MG PO TBEC
40.0000 mg | DELAYED_RELEASE_TABLET | Freq: Every day | ORAL | Status: DC
Start: 2013-10-07 — End: 2013-10-08

## 2013-10-07 MED ORDER — HEPARIN (PORCINE) IN NACL 2-0.9 UNIT/ML-% IJ SOLN
INTRAMUSCULAR | Status: AC
Start: 2013-10-07 — End: 2013-10-07
  Filled 2013-10-07: qty 1000

## 2013-10-07 MED ORDER — DIAZEPAM 5 MG PO TABS
5.0000 mg | ORAL_TABLET | ORAL | Status: AC
  Administered 2013-10-07: 5 mg via ORAL

## 2013-10-07 MED ORDER — SODIUM CHLORIDE 0.9 % IJ SOLN: 3.0000 mL | INTRAMUSCULAR | Status: DC | PRN

## 2013-10-07 MED ORDER — ACETAMINOPHEN 325 MG PO TABS
650.0000 mg | ORAL_TABLET | Freq: Four times a day (QID) | ORAL | Status: DC | PRN
  Administered 2013-10-07: 650 mg via ORAL
  Filled 2013-10-07: qty 2

## 2013-10-07 MED ORDER — HEPARIN BOLUS VIA INFUSION
4000.0000 [IU] | Freq: Once | INTRAVENOUS | Status: AC
  Administered 2013-10-07: 4000 [IU] via INTRAVENOUS
  Filled 2013-10-07: qty 4000

## 2013-10-07 MED ORDER — ASPIRIN 81 MG PO CHEW
324.0000 mg | CHEWABLE_TABLET | ORAL | Status: AC
  Administered 2013-10-07: 324 mg via ORAL

## 2013-10-07 MED ORDER — MIDAZOLAM HCL 2 MG/2ML IJ SOLN
INTRAMUSCULAR | Status: AC
  Filled 2013-10-07: qty 2

## 2013-10-07 MED ORDER — ASPIRIN 81 MG PO CHEW
CHEWABLE_TABLET | ORAL | Status: AC
  Filled 2013-10-07: qty 4

## 2013-10-07 MED ORDER — DIAZEPAM 5 MG PO TABS
ORAL_TABLET | ORAL | Status: AC
  Filled 2013-10-07: qty 1

## 2013-10-07 MED ORDER — ASPIRIN 300 MG RE SUPP: 300.0000 mg | RECTAL | Status: AC

## 2013-10-07 MED ORDER — NITROGLYCERIN 0.2 MG/ML ON CALL CATH LAB
INTRAVENOUS | Status: AC
  Filled 2013-10-07: qty 1

## 2013-10-07 MED ORDER — SODIUM CHLORIDE 0.9 % IV SOLN: INTRAVENOUS | Status: AC

## 2013-10-07 MED ORDER — LIDOCAINE HCL (PF) 1 % IJ SOLN
INTRAMUSCULAR | Status: AC
  Filled 2013-10-07: qty 30

## 2013-10-07 MED ORDER — SERTRALINE HCL 50 MG PO TABS
50.0000 mg | ORAL_TABLET | Freq: Every day | ORAL | Status: DC
  Administered 2013-10-07 – 2013-10-08 (×2): 50 mg via ORAL
  Filled 2013-10-07 (×2): qty 1

## 2013-10-07 MED ORDER — NITROGLYCERIN IN D5W 200-5 MCG/ML-% IV SOLN: 5.0000 ug/min | INTRAVENOUS | Status: DC

## 2013-10-07 MED ORDER — HEPARIN (PORCINE) IN NACL 100-0.45 UNIT/ML-% IJ SOLN
1300.0000 [IU]/h | INTRAMUSCULAR | Status: DC
  Administered 2013-10-07: 1300 [IU]/h via INTRAVENOUS
  Filled 2013-10-07 (×2): qty 250

## 2013-10-07 MED ORDER — ATORVASTATIN CALCIUM 80 MG PO TABS
80.0000 mg | ORAL_TABLET | Freq: Every day | ORAL | Status: DC
  Administered 2013-10-07: 80 mg via ORAL
  Filled 2013-10-07 (×2): qty 1

## 2013-10-07 MED ORDER — LOSARTAN POTASSIUM 50 MG PO TABS
50.0000 mg | ORAL_TABLET | Freq: Every day | ORAL | Status: DC
  Administered 2013-10-07 – 2013-10-08 (×2): 50 mg via ORAL
  Filled 2013-10-07 (×2): qty 1

## 2013-10-07 MED ORDER — NITROGLYCERIN 0.4 MG SL SUBL: 0.4000 mg | SUBLINGUAL_TABLET | SUBLINGUAL | Status: DC | PRN

## 2013-10-07 MED ORDER — AMLODIPINE BESYLATE 5 MG PO TABS
5.0000 mg | ORAL_TABLET | Freq: Every day | ORAL | Status: DC
  Administered 2013-10-07 – 2013-10-08 (×2): 5 mg via ORAL
  Filled 2013-10-07 (×2): qty 1

## 2013-10-07 MED ORDER — ASPIRIN EC 81 MG PO TBEC
81.0000 mg | DELAYED_RELEASE_TABLET | Freq: Every day | ORAL | Status: DC
  Administered 2013-10-08 (×2): 81 mg via ORAL
  Filled 2013-10-07: qty 1

## 2013-10-07 MED ORDER — SODIUM CHLORIDE 0.9 % IV SOLN: 250.0000 mL | INTRAVENOUS | Status: DC | PRN

## 2013-10-07 MED ORDER — NITROGLYCERIN IN D5W 200-5 MCG/ML-% IV SOLN
5.0000 ug/min | INTRAVENOUS | Status: DC
  Administered 2013-10-07: 5 ug/min via INTRAVENOUS
  Filled 2013-10-07: qty 250

## 2013-10-07 MED ORDER — ASPIRIN 81 MG PO CHEW: 81.0000 mg | CHEWABLE_TABLET | ORAL | Status: DC

## 2013-10-07 MED ORDER — ASPIRIN 81 MG PO CHEW: 81.0000 mg | CHEWABLE_TABLET | Freq: Every day | ORAL | Status: DC

## 2013-10-07 MED ORDER — SODIUM CHLORIDE 0.9 % IJ SOLN: 3.0000 mL | Freq: Two times a day (BID) | INTRAMUSCULAR | Status: DC

## 2013-10-07 NOTE — Progress Notes (Signed)
Subjective:  Patient denies any chest pain or shortness of breath. States do not have any transportation to go home daughter is working should be able to go home tomorrow  Objective:  Vital Signs in the last 24 hours: Temp:  [97.7 F (36.5 C)-98.4 F (36.9 C)] 97.7 F (36.5 C) (02/12 1025) Pulse Rate:  [45-70] 50 (02/12 1300) Resp:  [13-20] 18 (02/12 1025) BP: (126-168)/(48-133) 160/88 mmHg (02/12 1300) SpO2:  [97 %-100 %] 98 % (02/12 1025) Weight:  [116.03 kg (255 lb 12.8 oz)-117.283 kg (258 lb 9 oz)] 116.03 kg (255 lb 12.8 oz) (02/12 0400)  Intake/Output from previous day:   Intake/Output from this shift: Total I/O In: 240 [P.O.:240] Out: 450 [Urine:450]  Physical Exam: Neck: no adenopathy, no carotid bruit, no JVD and supple, symmetrical, trachea midline Lungs: clear to auscultation bilaterally Heart: regular rate and rhythm, S1, S2 normal, no murmur, click, rub or gallop Abdomen: soft, non-tender; bowel sounds normal; no masses,  no organomegaly Extremities: extremities normal, atraumatic, no cyanosis or edema and Right groin stable  Lab Results:  Recent Labs  10/06/13 2344 10/07/13 1050  WBC 8.3 5.9  HGB 12.5 12.8  PLT 219 222    Recent Labs  10/06/13 2344 10/07/13 1050  NA 139 140  K 4.1 4.4  CL 104 103  CO2 21 23  GLUCOSE 92 114*  BUN 28* 19  CREATININE 0.96 0.80    Recent Labs  10/07/13 1050 10/07/13 1342  TROPONINI <0.30 <0.30   Hepatic Function Panel  Recent Labs  10/07/13 1050  PROT 7.4  ALBUMIN 3.8  AST 24  ALT 18  ALKPHOS 68  BILITOT 0.3   No results found for this basename: CHOL,  in the last 72 hours No results found for this basename: PROTIME,  in the last 72 hours  Imaging: Imaging results have been reviewed and Dg Chest 2 View  10/07/2013   CLINICAL DATA:  Chest pain.  EXAM: CHEST  2 VIEW  COMPARISON:  DG CHEST 1V PORT dated 10/27/2010; DG CHEST 1 VIEW dated 01/31/2007  FINDINGS: Cardiopericardial silhouette within normal  limits. Tortuous thoracic aorta. Radiopaque density projects over the left midshaft of the clavicle, presumably external to the patient. No airspace disease. No pleural effusion. Suboptimal evaluation of the anterior mediastinal on the lateral view because the arms are not adequately raised over head.  IMPRESSION: No acute cardiopulmonary disease.   Electronically Signed   By: Dereck Ligas M.D.   On: 10/07/2013 00:47    Cardiac Studies:  Assessment/Plan:  Status post recurrent chest pain MI ruled out status post left cath Mild coronary artery disease Hypertension Morbid obesity GERD Gouty arthritis Posterior family history of coronary artery disease Plan Continue present management Will do GI workup as outpatient if continues to have recurrent chest pain DC home tomorrow  LOS: 0 days    Shelby Meza N 10/07/2013, 5:39 PM

## 2013-10-07 NOTE — Interval H&P Note (Signed)
Cath Lab Visit (complete for each Cath Lab visit)  Clinical Evaluation Leading to the Procedure:   ACS: no  Non-ACS:    Anginal Classification: CCS III  Anti-ischemic medical therapy: Maximal Therapy (2 or more classes of medications)  Non-Invasive Test Results: No non-invasive testing performed  Prior CABG: No previous CABG      History and Physical Interval Note:  10/07/2013 7:29 AM  Shelby Meza  has presented today for surgery, with the diagnosis of cp  The various methods of treatment have been discussed with the patient and family. After consideration of risks, benefits and other options for treatment, the patient has consented to  Procedure(s): LEFT HEART CATHETERIZATION WITH CORONARY ANGIOGRAM (N/A) as a surgical intervention .  The patient's history has been reviewed, patient examined, no change in status, stable for surgery.  I have reviewed the patient's chart and labs.  Questions were answered to the patient's satisfaction.     Clent Demark

## 2013-10-07 NOTE — H&P (Signed)
Shelby Meza is an 73 y.o. female.   Chief Complaint: Recurrent chest pain HPI: Patient is 73 year old female with past medical history significant for coronary artery disease history of MI many years ago, hypertension, and morbid obesity, GERD, history of gouty arthritis, positive family history of coronary artery disease father died of massive MI in his 82s, came to the ER complaining of recurrent retrosternal chest pain described as tightness initially started on Sunday while walking from church two-car and yesterday again had chest tightness while cleaning the apartment radiating to both shoulders and neck associated with mild nausea chest tightness was grade 8/10 relieved with sublingual nitroglycerin. Patient denies any shortness of breath palpitations or diaphoresis. Denies any history of PND orthopnea leg swelling. Denies any recent cardiac workup. Patient denies any relation of chest tightness to breathing movement or food.  Past Medical History  Diagnosis Date  . Coronary artery disease   . Hypertension     History reviewed. No pertinent past surgical history.  No family history on file. Social History:  reports that she has never smoked. She does not have any smokeless tobacco history on file. She reports that she does not drink alcohol or use illicit drugs.  Allergies: No Known Allergies  Medications Prior to Admission  Medication Sig Dispense Refill  . allopurinol (ZYLOPRIM) 300 MG tablet Take 300 mg by mouth daily.      Marland Kitchen aspirin 81 MG chewable tablet Chew 81 mg by mouth daily.      . cyclobenzaprine (FLEXERIL) 10 MG tablet Take 10 mg by mouth 3 (three) times daily as needed for muscle spasms.      Marland Kitchen LORazepam (ATIVAN) 1 MG tablet Take 0.5 mg by mouth 2 (two) times daily.      Marland Kitchen losartan (COZAAR) 50 MG tablet Take 50 mg by mouth daily.      . metoprolol tartrate (LOPRESSOR) 25 MG tablet Take 12.5 mg by mouth 2 (two) times daily.      . nitroGLYCERIN (NITROSTAT) 0.4 MG SL  tablet Place 0.4 mg under the tongue every 5 (five) minutes as needed for chest pain.      . Omega-3 Fatty Acids (FISH OIL PO) Take 1 capsule by mouth daily.      Marland Kitchen omeprazole (PRILOSEC) 20 MG capsule Take 20 mg by mouth daily.      . sertraline (ZOLOFT) 50 MG tablet Take 50 mg by mouth daily.      . traMADol (ULTRAM) 50 MG tablet Take 50 mg by mouth 2 (two) times daily.      . vitamin B-12 (CYANOCOBALAMIN) 1000 MCG tablet Take 1,000 mcg by mouth daily.        Results for orders placed during the hospital encounter of 10/07/13 (from the past 48 hour(s))  CBC     Status: Abnormal   Collection Time    10/06/13 11:44 PM      Result Value Ref Range   WBC 8.3  4.0 - 10.5 K/uL   RBC 4.22  3.87 - 5.11 MIL/uL   Hemoglobin 12.5  12.0 - 15.0 g/dL   HCT 36.8  36.0 - 46.0 %   MCV 87.2  78.0 - 100.0 fL   MCH 29.6  26.0 - 34.0 pg   MCHC 34.0  30.0 - 36.0 g/dL   RDW 15.9 (*) 11.5 - 15.5 %   Platelets 219  150 - 400 K/uL  BASIC METABOLIC PANEL     Status: Abnormal   Collection Time  10/06/13 11:44 PM      Result Value Ref Range   Sodium 139  137 - 147 mEq/L   Potassium 4.1  3.7 - 5.3 mEq/L   Chloride 104  96 - 112 mEq/L   CO2 21  19 - 32 mEq/L   Glucose, Bld 92  70 - 99 mg/dL   BUN 28 (*) 6 - 23 mg/dL   Creatinine, Ser 0.96  0.50 - 1.10 mg/dL   Calcium 9.5  8.4 - 10.5 mg/dL   GFR calc non Af Amer 57 (*) >90 mL/min   GFR calc Af Amer 66 (*) >90 mL/min   Comment: (NOTE)     The eGFR has been calculated using the CKD EPI equation.     This calculation has not been validated in all clinical situations.     eGFR's persistently <90 mL/min signify possible Chronic Kidney     Disease.  TROPONIN I     Status: None   Collection Time    10/07/13 12:23 AM      Result Value Ref Range   Troponin I <0.30  <0.30 ng/mL   Comment:            Due to the release kinetics of cTnI,     a negative result within the first hours     of the onset of symptoms does not rule out     myocardial infarction with  certainty.     If myocardial infarction is still suspected,     repeat the test at appropriate intervals.  POCT I-STAT TROPONIN I     Status: None   Collection Time    10/07/13 12:49 AM      Result Value Ref Range   Troponin i, poc 0.02  0.00 - 0.08 ng/mL   Comment 3            Comment: Due to the release kinetics of cTnI,     a negative result within the first hours     of the onset of symptoms does not rule out     myocardial infarction with certainty.     If myocardial infarction is still suspected,     repeat the test at appropriate intervals.   Dg Chest 2 View  10/07/2013   CLINICAL DATA:  Chest pain.  EXAM: CHEST  2 VIEW  COMPARISON:  DG CHEST 1V PORT dated 10/27/2010; DG CHEST 1 VIEW dated 01/31/2007  FINDINGS: Cardiopericardial silhouette within normal limits. Tortuous thoracic aorta. Radiopaque density projects over the left midshaft of the clavicle, presumably external to the patient. No airspace disease. No pleural effusion. Suboptimal evaluation of the anterior mediastinal on the lateral view because the arms are not adequately raised over head.  IMPRESSION: No acute cardiopulmonary disease.   Electronically Signed   By: Dereck Ligas M.D.   On: 10/07/2013 00:47    Review of Systems  Constitutional: Negative for fever and chills.  Eyes: Negative for blurred vision and double vision.  Respiratory: Negative for cough, hemoptysis and sputum production.   Cardiovascular: Positive for chest pain. Negative for palpitations, orthopnea, claudication and leg swelling.  Gastrointestinal: Positive for nausea. Negative for vomiting and abdominal pain.  Genitourinary: Negative for dysuria.  Neurological: Negative for dizziness and headaches.    Blood pressure 163/72, pulse 49, temperature 98.4 F (36.9 C), temperature source Oral, resp. rate 16, height 5' 4"  (1.626 m), weight 116.03 kg (255 lb 12.8 oz), SpO2 98.00%. Physical Exam  Constitutional: She is oriented to  person, place, and  time.  HENT:  Head: Atraumatic.  Eyes: Conjunctivae are normal. Pupils are equal, round, and reactive to light. Left eye exhibits no discharge. No scleral icterus.  Neck: Normal range of motion. Neck supple. No JVD present. No tracheal deviation present. No thyromegaly present.  Cardiovascular: Normal rate and regular rhythm.   Murmur (Soft systolic murmur noted no S3 gallop) heard. Respiratory: Effort normal and breath sounds normal. No respiratory distress. She has no wheezes. She has no rales.  GI: Soft. Bowel sounds are normal. There is no tenderness. There is no rebound.  Musculoskeletal: She exhibits no edema and no tenderness.  Neurological: She is alert and oriented to person, place, and time.     Assessment/Plan Unstable angina rule out MI Coronary artery disease history of MI in the past Hypertension Morbid obesity GERD Gouty arthritis Positive family history of coronary artery disease Plan Discussed with patient and her daughter at length various options of treatment i.e. noninvasive stress testing versus left cath possible PTCA stenting its risk and benefits i.e. death MI stroke need for emergency CABG local vascular complications etc. and consented for PCI  [facility] N 10/07/2013, 7:12 AM

## 2013-10-07 NOTE — ED Provider Notes (Signed)
CSN: [id number]     Arrival date & time 10/06/13  2334 History   First MD Initiated Contact with Patient 10/07/13 0022     Chief Complaint  Patient presents with  . Chest Pain     (Consider location/radiation/quality/duration/timing/severity/associated sxs/prior Treatment) HPI 73 year old female presents to emergency department from home with complaint of intermittent chest, upper back, and left arm pain since Sunday.  Patient reports she has been taking nitroglycerin, which makes her symptoms better.  She denies any shortness of breath.  She has mild nausea.  Patient has history of osteoarthritis, and is unsure if symptoms of pain are due to arthritis or angina.  Patient has history of "mild heart attack.".  She denies any stents.  Dr. Lyla Son is her doctor.  Patient, reports she's been taking all her medication.  Pain was slightly worse yesterday, when she was cleaning her apartment.  When the pain persisted through today, she decided to come to the emergency department. Past Medical History  Diagnosis Date  . Coronary artery disease   . Hypertension    History reviewed. No pertinent past surgical history. No family history on file. History  Substance Use Topics  . Smoking status: Never Smoker   . Smokeless tobacco: Not on file  . Alcohol Use: No   OB History   Grav Para Term Preterm Abortions TAB SAB Ect Mult Living                 Review of Systems  See History of Present Illness; otherwise all other systems are reviewed and negative   Allergies  Review of patient's allergies indicates no known allergies.  Home Medications   Current Outpatient Rx  Name  Route  Sig  Dispense  Refill  . allopurinol (ZYLOPRIM) 300 MG tablet   Oral   Take 300 mg by mouth daily.         Marland Kitchen aspirin 81 MG chewable tablet   Oral   Chew 81 mg by mouth daily.         . cyclobenzaprine (FLEXERIL) 10 MG tablet   Oral   Take 10 mg by mouth 3 (three) times daily as needed for muscle  spasms.         Marland Kitchen LORazepam (ATIVAN) 1 MG tablet   Oral   Take 0.5 mg by mouth 2 (two) times daily.         Marland Kitchen losartan (COZAAR) 50 MG tablet   Oral   Take 50 mg by mouth daily.         . metoprolol tartrate (LOPRESSOR) 25 MG tablet   Oral   Take 12.5 mg by mouth 2 (two) times daily.         . nitroGLYCERIN (NITROSTAT) 0.4 MG SL tablet   Sublingual   Place 0.4 mg under the tongue every 5 (five) minutes as needed for chest pain.         . Omega-3 Fatty Acids (FISH OIL PO)   Oral   Take 1 capsule by mouth daily.         Marland Kitchen omeprazole (PRILOSEC) 20 MG capsule   Oral   Take 20 mg by mouth daily.         . sertraline (ZOLOFT) 50 MG tablet   Oral   Take 50 mg by mouth daily.         . traMADol (ULTRAM) 50 MG tablet   Oral   Take 50 mg by mouth 2 (two) times daily.         Marland Kitchen  vitamin B-12 (CYANOCOBALAMIN) 1000 MCG tablet   Oral   Take 1,000 mcg by mouth daily.          BP 141/77  Pulse 52  Temp(Src) 97.9 F (36.6 C) (Oral)  Resp 13  Wt 258 lb 9 oz (117.283 kg)  SpO2 100% Physical Exam  Nursing note and vitals reviewed. Constitutional: She is oriented to person, place, and time. She appears well-developed and well-nourished. No distress.  HENT:  Head: Normocephalic and atraumatic.  Nose: Nose normal.  Mouth/Throat: Oropharynx is clear and moist.  Eyes: Conjunctivae and EOM are normal. Pupils are equal, round, and reactive to light.  Neck: Normal range of motion. Neck supple. No JVD present. No tracheal deviation present. No thyromegaly present.  Cardiovascular: Normal rate, regular rhythm, normal heart sounds and intact distal pulses.  Exam reveals no gallop and no friction rub.   No murmur heard. Pulmonary/Chest: Effort normal and breath sounds normal. No stridor. No respiratory distress. She has no wheezes. She has no rales. She exhibits no tenderness.  Abdominal: Soft. Bowel sounds are normal. She exhibits no distension and no mass. There is no  tenderness. There is no rebound and no guarding.  Musculoskeletal: Normal range of motion. She exhibits no edema and no tenderness.  Lymphadenopathy:    She has no cervical adenopathy.  Neurological: She is alert and oriented to person, place, and time. She has normal reflexes. No cranial nerve deficit. She exhibits normal muscle tone. Coordination normal.  Skin: Skin is warm and dry. No rash noted. No erythema. No pallor.  Psychiatric: She has a normal mood and affect. Her behavior is normal. Judgment and thought content normal.    ED Course  Procedures (including critical care time) Labs Review Labs Reviewed  CBC - Abnormal; Notable for the following:    RDW 15.9 (*)    All other components within normal limits  BASIC METABOLIC PANEL - Abnormal; Notable for the following:    BUN 28 (*)    GFR calc non Af Amer 57 (*)    GFR calc Af Amer 66 (*)    All other components within normal limits  TROPONIN I  POCT I-STAT TROPONIN I   Imaging Review Dg Chest 2 View  10/07/2013   CLINICAL DATA:  Chest pain.  EXAM: CHEST  2 VIEW  COMPARISON:  DG CHEST 1V PORT dated 10/27/2010; DG CHEST 1 VIEW dated 01/31/2007  FINDINGS: Cardiopericardial silhouette within normal limits. Tortuous thoracic aorta. Radiopaque density projects over the left midshaft of the clavicle, presumably external to the patient. No airspace disease. No pleural effusion. Suboptimal evaluation of the anterior mediastinal on the lateral view because the arms are not adequately raised over head.  IMPRESSION: No acute cardiopulmonary disease.   Electronically Signed   By: Dereck Ligas M.D.   On: 10/07/2013 00:47    EKG Interpretation    Date/Time:  Wednesday October 06 2013 23:39:52 EST Ventricular Rate:  51 PR Interval:  168 QRS Duration: 80 QT Interval:  458 QTC Calculation: 422 R Axis:   -41 Text Interpretation:  Sinus bradycardia Left axis deviation Abnormal ECG Since last tracing rate slower Confirmed by Ansel Ferrall  MD, Kron Everton  [id number]) on 10/07/2013 12:33:55 AM            MDM   Final diagnoses:  Bradycardia  Chest pain    73 year old female with intermittent chest and upper back pain.  Symptoms seem slightly exertional in nature.  Concern for angina.  Patient pain free  at present.  We'll get labs, EKG, chest x-ray, and we'll plan to get in contact with her cardiologist.  2:19 AM He should reports return of her discomfort in chest and upper back.  We'll start IV nitroglycerin, contact Dr. Terrence Dupont.    Kalman Drape, MD 10/07/13 [phone]

## 2013-10-07 NOTE — Progress Notes (Signed)
UR completed 

## 2013-10-07 NOTE — CV Procedure (Signed)
Left cardiac cath report dictated on 10/07/2013 dictation number is [id number]

## 2013-10-07 NOTE — Progress Notes (Signed)
Upon arrival and transfer to her bed , Shelby Meza developed a hematoma at the rfa site. I held manual pressure for 15 minutes and the hematoma was reduced. Before leaving , groin site was rechecked and hematoma was returning. Manual pressure held again for 20 minutes. Hematoma reduced to non palpable hematoma. Distal pulse rt dp palpable. Bedrest instructions given. Tegaderm dressing in place, groin level 0.

## 2013-10-07 NOTE — Progress Notes (Signed)
ANTICOAGULATION CONSULT NOTE - Initial Consult  Pharmacy Consult for Heparin Indication: chest pain/ACS  No Known Allergies  Patient Measurements: Height: 5\' 5"  (165.1 cm) Weight: 258 lb 9 oz (117.283 kg) IBW/kg (Calculated) : 57 Heparin Dosing Weight: 85 kg  Vital Signs: Temp: 97.9 F (36.6 C) (02/11 2345) Temp src: Oral (02/11 2345) BP: 141/77 mmHg (02/12 0200) Pulse Rate: 52 (02/12 0200)  Labs:  Recent Labs  10/06/13 2344 10/07/13 0023  HGB 12.5  --   HCT 36.8  --   PLT 219  --   CREATININE 0.96  --   TROPONINI  --  <0.30    Estimated Creatinine Clearance: 66.8 ml/min (by C-G formula based on Cr of 0.96).   Medical History: Past Medical History  Diagnosis Date  . Coronary artery disease   . Hypertension     Medications:  Allopurinol  ASA  Flexeril  Ativan  Losartan  Lopressor  Ntg  Lovaza  Prilosec  Zoloft  Tramadol  Vit B12  Assessment: 73 yo female with chest pain for heparin  Goal of Therapy:  Heparin level 0.3-0.7 units/ml Monitor platelets by anticoagulation protocol: Yes   Plan:  Heparin 4000 units IV bolus, then 1300 units/hr Check heparin level in 6 hours.  Damaso Laday, Bronson Curb 10/07/2013,2:40 AM

## 2013-10-07 NOTE — ED Notes (Signed)
MD aware of pt's HR and that pt is now experiencing bilateral neck muscle tightness. See new orders.

## 2013-10-07 NOTE — ED Notes (Signed)
Pt reports she has 1 nitroglycerin tablet yesterday as well as baby aspirin.

## 2013-10-07 NOTE — Care Management Note (Unsigned)
    Page 1 of 1   10/07/2013     3:55:10 PM   CARE MANAGEMENT NOTE 10/07/2013  Patient:  Shelby Meza, Shelby Meza   Account Number:  [id number]  Date Initiated:  10/07/2013  Documentation initiated by:  Conor Lata  Subjective/Objective Assessment:   PT ADM ON 2/11 WITH CHEST PAIN.  PTA, PT INDEPENDENT OF ADLS.     Action/Plan:   WILL FOLLOW FOR DC NEEDS AS PT PROGRESSES.   Anticipated DC Date:  10/08/2013   Anticipated DC Plan:  [place]  CM consult      Choice offered to / List presented to:             Status of service:  In process, will continue to follow Medicare Important Message given?   (If response is "NO", the following Medicare IM given date fields will be blank) Date Medicare IM given:   Date Additional Medicare IM given:    Discharge Disposition:    Per UR Regulation:  Reviewed for med. necessity/level of care/duration of stay  If discussed at [place] of Stay Meetings, dates discussed:    Comments:

## 2013-10-08 ENCOUNTER — Encounter (HOSPITAL_COMMUNITY): Payer: Self-pay

## 2013-10-08 LAB — CBC
HCT: 34.3 % — ABNORMAL LOW (ref 36.0–46.0)
HEMOGLOBIN: 11.6 g/dL — AB (ref 12.0–15.0)
MCH: 29.6 pg (ref 26.0–34.0)
MCHC: 33.8 g/dL (ref 30.0–36.0)
MCV: 87.5 fL (ref 78.0–100.0)
Platelets: 218 10*3/uL (ref 150–400)
RBC: 3.92 MIL/uL (ref 3.87–5.11)
RDW: 16.1 % — ABNORMAL HIGH (ref 11.5–15.5)
WBC: 8.1 10*3/uL (ref 4.0–10.5)

## 2013-10-08 LAB — LIPID PANEL
Cholesterol: 126 mg/dL (ref 0–200)
HDL: 46 mg/dL (ref >39)
LDL Cholesterol: 67 mg/dL (ref 0–99)
Total CHOL/HDL Ratio: 2.7 RATIO
Triglycerides: 65 mg/dL (ref <150)
VLDL: 13 mg/dL (ref 0–40)

## 2013-10-08 LAB — BASIC METABOLIC PANEL
BUN: 17 mg/dL (ref 6–23)
CHLORIDE: 105 meq/L (ref 96–112)
CO2: 24 mEq/L (ref 19–32)
Calcium: 9.2 mg/dL (ref 8.4–10.5)
Creatinine, Ser: 0.93 mg/dL (ref 0.50–1.10)
GFR calc non Af Amer: 60 mL/min — ABNORMAL LOW (ref >90)
GFR, EST AFRICAN AMERICAN: 69 mL/min — AB (ref >90)
Glucose, Bld: 103 mg/dL — ABNORMAL HIGH (ref 70–99)
POTASSIUM: 3.8 meq/L (ref 3.7–5.3)
SODIUM: 140 meq/L (ref 137–147)

## 2013-10-08 MED ORDER — AMLODIPINE BESYLATE 5 MG PO TABS
5.0000 mg | ORAL_TABLET | Freq: Every day | ORAL | Status: DC
Start: 2013-10-08 — End: 2019-12-11

## 2013-10-08 NOTE — Discharge Summary (Signed)
  Discharge summary dictated on 10/08/2013 dictation number is [phone]

## 2013-10-08 NOTE — Cardiovascular Report (Signed)
Shelby Meza, Shelby Meza NO.:  [id number]  MEDICAL RECORD NO.:  [id number]  LOCATION:  [id number]                        FACILITY:  [facility]  PHYSICIAN:  Allegra Lai. Terrence Dupont, M.D. DATE OF BIRTH:  Feb 12, 1941  DATE OF PROCEDURE:  10/07/2013 DATE OF DISCHARGE:                           CARDIAC CATHETERIZATION   PROCEDURE:  Left cardiac cath with selective left and right coronary angiography, left ventriculography via right groin using Judkins technique.  INDICATION FOR THE PROCEDURE:  Shelby Meza is a 73 year old female with past medical history significant for coronary artery disease, history of MI many years ago as per patient, hypertension, morbid obesity, GERD, history of gouty arthritis, and positive family history of coronary artery disease.  Father died of massive MI in his 70s.  She came to the ER complaining of recurrent retrosternal chest pain described as tightness, initially started on Sunday while walking from church to the car; yesterday again had similar chest tightness while cleaning the apartment, radiating to both shoulders and neck, associated with mild nausea.  Tightness was grade 8/10 and relieved with sublingual nitro. The patient denies any shortness of breath, palpitation, or diaphoresis. Denies history of PND, orthopnea, or leg swelling.  Denies any recent cardiac workup.  Denies any relation of chest pain to food, breathing, or movement.  Due to typical chest pain, discussed with the patient and daughter at length regarding various options of treatment, i.e., noninvasive stress testing versus left cath, possible PTCA stenting, its risks and benefits, i.e., death, MI, stroke, need for emergency CABG, local vascular complications, etc., and consented for procedure.  DESCRIPTION OF PROCEDURE:  After obtaining informed consent, the patient was brought to the cath lab and was placed on fluoroscopy table.  Right groin was prepped and draped in usual  fashion.  Xylocaine 1% was used for local anesthesia in the right groin.  With the help of thin wall needle, 5-French arterial sheath was placed.  The sheath was aspirated and flushed.  Next, a 5-French left Judkins catheter was advanced over the wire under fluoroscopic guidance up to the ascending aorta.  Wire was pulled out.  The catheter was aspirated and connected to the Manifold.  Catheter was further advanced and attempted to engage into left coronary ostium without success.  Next, 5-French EBU 4 guiding catheter, and then 5-French Erad left guiding catheter was advanced over the wire under fluoroscopic guidance up to the ascending aorta.  Wire was pulled out.  The catheter was aspirated and connected to the Manifold.  Catheter was further advanced and engaged into left coronary ostium.  Multiple views of the left system were taken.  Next, catheter was disengaged and was pulled out over the wire and was replaced with 5- [place] right Judkins catheter, and then 5-French no torque catheter, which was advanced over the wire under fluoroscopic guidance up to the ascending aorta.  Wire was pulled out.  The catheter was aspirated and connected to the Manifold.  Catheter was further advanced and attempted to engaged into right coronary ostium without success and then a 5- [place] AL1 diagnostic catheter was advanced over the wire under fluoroscopic guidance up to the ascending aorta.  Wire was pulled out. The  catheter was aspirated and connected to the Manifold.  Catheter was further advanced and engaged into the right coronary ostium.  Multiple views of the right system were taken.  Next, catheter was disengaged and was pulled out over the wire and was replaced with 5-French pigtail catheter, which was advanced over the wire under fluoroscopic guidance up to the ascending aorta.  Wire was pulled out.  The catheter was aspirated and connected to the Manifold.  Catheter was further  advanced across the aortic valve into the LV.  LV pressures were recorded.  LV graft was done in 30-degree RAO position.  Post-angiographic pressures were recorded from LV and then pullback pressures were recorded from the aorta.  There was no gradient across aortic valve. Next, the pigtail catheter was pulled out over the wire.  Sheaths were aspirated and flushed.  FINDINGS:  LV showed good LV systolic function.  EF of 60-65%.  There was moderate LVH, left main was long, which was patent.  LAD has 10-15% mid stenosis.  Diagonal 1 was large, which was patent.  Diagonal 2 was small, which was patent.  Left circumflex was patent.  OM1 was very small.  OM2 was large, which was patent.  OM3 was small, which was patent.  RCA was patent.  PDA and PLV branches were small, which were patent.  The patient had codominant coronary system.  The patient tolerated the procedure well.  There were no complications.  The patient was transferred to recovery room in stable condition.     Allegra Lai. Terrence Dupont, M.D.     MNH/MEDQ  D:  10/07/2013  T:  10/08/2013  Job:  [id number]

## 2013-10-08 NOTE — Discharge Instructions (Signed)
Coronary Angiography Coronary angiography is an X-ray procedure used to look at the arteries in the heart. In this procedure, a dye (contrast dye) is injected through a long, hollow tube (catheter). The catheter is about the size of a piece of cooked spaghetti and is inserted through your groin, wrist, or arm. The dye is injected into each artery, and X-rays are then taken to show if there is a blockage in the arteries of your heart. LET [facility] CARE PROVIDER KNOW ABOUT:  Any allergies you have, including allergies to shellfish or contrast dye.   All medicines you are taking, including vitamins, herbs, eye drops, creams, and over-the-counter medicines.   Previous problems you or members of your family have had with the use of anesthetics.   Any blood disorders you have.   Previous surgeries you have had.  History of kidney problems or failure.   Other medical conditions you have. RISKS AND COMPLICATIONS  Generally, coronary angiography is a safe procedure. However, as with any procedure, complications can occur. Possible complications include:  Allergic reaction to the dye.  Bleeding from the access site or other locations.  Kidney injury, especially in people with impaired kidney function.  Stroke (rare).  Heart attack (rare). BEFORE THE PROCEDURE   Do not eat or drink anything after midnight the night before the procedure, or as directed by your health care provider.   Ask your health care provider if it is okay to take any needed medicines with a sip of water.  PROCEDURE  You may be given a medicine to help you relax (sedative) before the procedure. This medicine is given through an intravenous (IV) access tube that is inserted into one of your veins.   The area where the catheter will be inserted is washed and shaved. This is usually done in the groin but may be done in the fold of your arm (near your elbow) or in the wrist.   A medicine will be given to  numb the area where the catheter will be inserted (local anesthetic).   The health care provider will insert the catheter into an artery. The catheter is guided by using a special type of X-ray (fluoroscopy) of the blood vessel being examined.   A special dye is then injected into the catheter, and X-rays are taken. The dye helps to show where any narrowing or blockages are located in the heart arteries.  AFTER THE PROCEDURE   If the procedure is done through the leg, you will be kept in bed lying flat for several hours. You will be instructed to not bend or cross your legs.  The insertion site will be checked frequently.   The pulse in your feet or wrist will be checked frequently.   Additional blood tests, X-rays, and an electrocardiogram may be done.   You may need to stay in the hospital overnight for observation.  Document Released: 02/16/2003 Document Revised: 04/14/2013 Document Reviewed: 01/04/2013 [facility] Patient Information 2014 [employer].

## 2013-10-09 NOTE — Discharge Summary (Signed)
Shelby Meza, NICOSIA NO.:  [id number]  MEDICAL RECORD NO.:  [id number]  LOCATION:  2W26C                        FACILITY:  [facility]  PHYSICIAN:  Allegra Lai. Terrence Dupont, M.D. DATE OF BIRTH:  09-Jun-1941  DATE OF ADMISSION:  10/06/2013 DATE OF DISCHARGE:  10/08/2013                              DISCHARGE SUMMARY   ADMITTING DIAGNOSES: 1. Unstable angina, rule out myocardial infarction. 2. Coronary artery disease, history of myocardial infarction in the     past. 3. Hypertension. 4. Morbid obesity. 5. Gastroesophageal reflux disease. 6. Gouty arthritis. 7. Positive family history of coronary artery disease.  FINAL DIAGNOSES: 1. Status post recurrent chest pain, myocardial infarction ruled out,     status post left cardiac catheterization. 2. Mild coronary artery disease. 3. Hypertension. 4. Morbid obesity. 5. Gastroesophageal reflux disease. 6. Gouty arthritis. 7. Positive family history of coronary artery disease. 8. Status post marked sinus bradycardia secondary to medications.  DISCHARGE MEDICATION LIST: 1. Amlodipine 5 mg 1 tablet daily. 2. Allopurinol 300 mg daily. 3. Aspirin 81 mg daily. 4. Flexeril 10 mg 3 times daily as needed. 5. Fish oil 1 capsule daily. 6. Lorazepam 0.5 mg twice daily. 7. Losartan 50 mg daily. 8. Nitrostat 0.4 mg sublingual use as directed. 9. Omeprazole 20 mg 1 capsule daily. 10.Zoloft 50 mg daily. 11.Tramadol 50 mg twice daily as needed for pain. 12.Vitamin B12 1 tablet daily.  The patient has been advised to stop     metoprolol.  DIET:  Low salt, low cholesterol.  Postcardiac cath instructions have been given.  Follow up with me in 1 week.  CONDITION AT DISCHARGE:  Stable.  BRIEF HISTORY AND HOSPITAL COURSE:  [name] is a 73 year old female with past medical history significant for coronary artery disease, history of MI many years ago as per the patient, hypertension, morbid obesity, GERD, history of gouty  arthritis, positive family history of coronary artery disease, father died of massive MI in his 73s.  She came to the ER complaining of recurrent retrosternal chest pain, described as tightness, initially started on Sunday while walking from the church to the car, and yesterday again had chest tightness while cleaning the apartment, radiating to both shoulders, neck associated with mild nausea.  States chest pain, chest tightness, it was grade 8/10, relieved with sublingual nitroglycerin.  The patient denies any shortness of breath, palpitation, lightheadedness, or syncope.  Denies any diaphoresis.  Denies any history of PND, orthopnea or leg swelling. Denies any recent cardiac workup.  Denies any relation of chest pain to food, breathing, or movement.  PAST MEDICAL HISTORY:  As above.  PHYSICAL EXAMINATION:  GENERAL:  She was alert, awake, oriented x3, in no acute distress. VITAL SIGNS:  Blood pressure was 163/72, pulse was 49.  She was afebrile. HEENT:  Conjunctiva was pink. NECK:  Supple.  No JVD.  No bruit. LUNGS:  Clear to auscultation without rhonchi or rales.  There was soft systolic murmur.  No S3 gallop. ABDOMEN:  Soft.  Bowel sounds were present, nontender. EXTREMITIES:  There was no clubbing, cyanosis, or edema.  LABORATORY DATA:  Sodium was 139, potassium 4.1, BUN 28, creatinine 0.96.  Three sets of troponin-I were  negative.  Cholesterol was 126, triglyceride 65, HDL 46, LDL 67.  Hemoglobin was 12.5, hematocrit 36.8, white count of 8.3.  Her EKG showed sinus bradycardia with nonspecific T- wave changes.  BRIEF HOSPITAL COURSE:  The patient was admitted to Telemetry Unit.  MI was ruled out by serial enzymes and EKG.  The patient subsequently underwent left cardiac cath with selective left and right coronary angiography as per procedure report.  The patient tolerated the procedure well.  There were no complications.  Postprocedure, the patient did not have any episodes  of chest tightness.  Her groin is stable with no evidence of hematoma or bruit.  The patient is ambulating in room without any problems and will be discharged home on above medications, and will be followed up in my office in 1 week.     Allegra Lai. Terrence Dupont, M.D.     MNH/MEDQ  D:  10/08/2013  T:  10/09/2013  Job:  [id number]

## 2013-12-29 ENCOUNTER — Other Ambulatory Visit (HOSPITAL_COMMUNITY): Payer: Self-pay | Admitting: Cardiology

## 2013-12-29 DIAGNOSIS — Z1231 Encounter for screening mammogram for malignant neoplasm of breast: Secondary | ICD-10-CM

## 2014-01-03 ENCOUNTER — Ambulatory Visit (HOSPITAL_COMMUNITY)
Admission: RE | Admit: 2014-01-03 | Discharge: 2014-01-03 | Disposition: A | Discharge status: Home / Self Care | Payer: Medicare Other | Source: Ambulatory Visit | Attending: Cardiology | Admitting: Cardiology

## 2014-01-03 DIAGNOSIS — Z1231 Encounter for screening mammogram for malignant neoplasm of breast: Secondary | ICD-10-CM | POA: Insufficient documentation

## 2014-08-04 ENCOUNTER — Encounter (HOSPITAL_COMMUNITY): Payer: Self-pay | Admitting: Cardiology

## 2014-12-12 ENCOUNTER — Other Ambulatory Visit (HOSPITAL_COMMUNITY): Payer: Self-pay | Admitting: Cardiology

## 2014-12-12 DIAGNOSIS — Z1231 Encounter for screening mammogram for malignant neoplasm of breast: Secondary | ICD-10-CM

## 2015-01-09 ENCOUNTER — Ambulatory Visit (HOSPITAL_COMMUNITY)
Admission: RE | Admit: 2015-01-09 | Discharge: 2015-01-09 | Disposition: A | Discharge status: Home / Self Care | Payer: Medicare Other | Source: Ambulatory Visit | Attending: Cardiology | Admitting: Cardiology

## 2015-01-09 DIAGNOSIS — Z1231 Encounter for screening mammogram for malignant neoplasm of breast: Secondary | ICD-10-CM | POA: Insufficient documentation

## 2015-01-11 ENCOUNTER — Other Ambulatory Visit: Payer: Self-pay | Admitting: Cardiology

## 2015-01-11 DIAGNOSIS — R928 Other abnormal and inconclusive findings on diagnostic imaging of breast: Secondary | ICD-10-CM

## 2015-01-16 ENCOUNTER — Other Ambulatory Visit: Payer: Medicare Other

## 2015-01-24 ENCOUNTER — Ambulatory Visit
Admission: RE | Admit: 2015-01-24 | Discharge: 2015-01-24 | Disposition: A | Discharge status: Home / Self Care | Payer: Medicare Other | Source: Ambulatory Visit | Attending: Cardiology | Admitting: Cardiology

## 2015-01-24 DIAGNOSIS — R928 Other abnormal and inconclusive findings on diagnostic imaging of breast: Secondary | ICD-10-CM

## 2015-12-24 ENCOUNTER — Emergency Department (HOSPITAL_COMMUNITY): Payer: Medicare Other

## 2015-12-24 ENCOUNTER — Encounter (HOSPITAL_COMMUNITY): Payer: Self-pay | Admitting: *Deleted

## 2015-12-24 ENCOUNTER — Inpatient Hospital Stay (HOSPITAL_COMMUNITY)
Admission: EM | Admit: 2015-12-24 | Discharge: 2015-12-25 | DRG: 313 | Disposition: A | Discharge status: Home / Self Care | Payer: Medicare Other | Attending: Cardiovascular Disease | Admitting: Cardiovascular Disease

## 2015-12-24 DIAGNOSIS — Z79899 Other long term (current) drug therapy: Secondary | ICD-10-CM | POA: Diagnosis not present

## 2015-12-24 DIAGNOSIS — Z6841 Body Mass Index (BMI) 40.0 and over, adult: Secondary | ICD-10-CM | POA: Diagnosis not present

## 2015-12-24 DIAGNOSIS — M109 Gout, unspecified: Secondary | ICD-10-CM | POA: Diagnosis present

## 2015-12-24 DIAGNOSIS — M1990 Unspecified osteoarthritis, unspecified site: Secondary | ICD-10-CM | POA: Diagnosis present

## 2015-12-24 DIAGNOSIS — I2510 Atherosclerotic heart disease of native coronary artery without angina pectoris: Secondary | ICD-10-CM | POA: Diagnosis present

## 2015-12-24 DIAGNOSIS — E785 Hyperlipidemia, unspecified: Secondary | ICD-10-CM | POA: Diagnosis present

## 2015-12-24 DIAGNOSIS — Z91018 Allergy to other foods: Secondary | ICD-10-CM

## 2015-12-24 DIAGNOSIS — N179 Acute kidney failure, unspecified: Secondary | ICD-10-CM | POA: Diagnosis present

## 2015-12-24 DIAGNOSIS — R079 Chest pain, unspecified: Secondary | ICD-10-CM | POA: Diagnosis present

## 2015-12-24 DIAGNOSIS — I10 Essential (primary) hypertension: Secondary | ICD-10-CM | POA: Diagnosis present

## 2015-12-24 DIAGNOSIS — Z7982 Long term (current) use of aspirin: Secondary | ICD-10-CM | POA: Diagnosis not present

## 2015-12-24 DIAGNOSIS — I249 Acute ischemic heart disease, unspecified: Secondary | ICD-10-CM | POA: Diagnosis present

## 2015-12-24 LAB — I-STAT TROPONIN, ED: TROPONIN I, POC: 0.02 ng/mL (ref 0.00–0.08)

## 2015-12-24 LAB — CBC
HEMATOCRIT: 38.3 % (ref 36.0–46.0)
HEMOGLOBIN: 12.6 g/dL (ref 12.0–15.0)
MCH: 29.7 pg (ref 26.0–34.0)
MCHC: 32.9 g/dL (ref 30.0–36.0)
MCV: 90.3 fL (ref 78.0–100.0)
Platelets: 210 10*3/uL (ref 150–400)
RBC: 4.24 MIL/uL (ref 3.87–5.11)
RDW: 15.4 % (ref 11.5–15.5)
WBC: 8.2 10*3/uL (ref 4.0–10.5)

## 2015-12-24 LAB — BASIC METABOLIC PANEL
Anion gap: 10 (ref 5–15)
BUN: 35 mg/dL — ABNORMAL HIGH (ref 6–20)
CO2: 22 mmol/L (ref 22–32)
CREATININE: 1.61 mg/dL — AB (ref 0.44–1.00)
Calcium: 10 mg/dL (ref 8.9–10.3)
Chloride: 110 mmol/L (ref 101–111)
GFR calc non Af Amer: 30 mL/min — ABNORMAL LOW (ref >60)
GFR, EST AFRICAN AMERICAN: 35 mL/min — AB (ref >60)
Glucose, Bld: 100 mg/dL — ABNORMAL HIGH (ref 65–99)
POTASSIUM: 4.4 mmol/L (ref 3.5–5.1)
Sodium: 142 mmol/L (ref 135–145)

## 2015-12-24 LAB — TROPONIN I

## 2015-12-24 MED ORDER — LORAZEPAM 0.5 MG PO TABS
0.5000 mg | ORAL_TABLET | Freq: Two times a day (BID) | ORAL | Status: DC
  Filled 2015-12-24 (×2): qty 1

## 2015-12-24 MED ORDER — HEPARIN (PORCINE) IN NACL 100-0.45 UNIT/ML-% IJ SOLN
1000.0000 [IU]/h | INTRAMUSCULAR | Status: DC
  Administered 2015-12-25: 1200 [IU]/h via INTRAVENOUS
  Filled 2015-12-24 (×2): qty 250

## 2015-12-24 MED ORDER — ATORVASTATIN CALCIUM 40 MG PO TABS
40.0000 mg | ORAL_TABLET | Freq: Every day | ORAL | Status: DC
  Administered 2015-12-25: 40 mg via ORAL
  Filled 2015-12-24: qty 1

## 2015-12-24 MED ORDER — ACETAMINOPHEN 325 MG PO TABS: 650.0000 mg | ORAL_TABLET | ORAL | Status: DC | PRN

## 2015-12-24 MED ORDER — ASPIRIN 300 MG RE SUPP: 300.0000 mg | RECTAL | Status: DC

## 2015-12-24 MED ORDER — SODIUM CHLORIDE 0.9% FLUSH: 3.0000 mL | Freq: Two times a day (BID) | INTRAVENOUS | Status: DC

## 2015-12-24 MED ORDER — ASPIRIN EC 81 MG PO TBEC
81.0000 mg | DELAYED_RELEASE_TABLET | Freq: Every day | ORAL | Status: DC
  Administered 2015-12-25: 81 mg via ORAL
  Filled 2015-12-24: qty 1

## 2015-12-24 MED ORDER — ALPRAZOLAM 0.25 MG PO TABS
0.2500 mg | ORAL_TABLET | Freq: Two times a day (BID) | ORAL | Status: DC | PRN
  Administered 2015-12-25: 0.25 mg via ORAL
  Filled 2015-12-24: qty 1

## 2015-12-24 MED ORDER — SODIUM CHLORIDE 0.9 % IV SOLN
INTRAVENOUS | Status: DC
  Administered 2015-12-24: 1000 mL via INTRAVENOUS

## 2015-12-24 MED ORDER — TRAMADOL HCL 50 MG PO TABS
50.0000 mg | ORAL_TABLET | Freq: Two times a day (BID) | ORAL | Status: DC
  Administered 2015-12-25 (×2): 50 mg via ORAL
  Filled 2015-12-24 (×2): qty 1

## 2015-12-24 MED ORDER — LOSARTAN POTASSIUM 50 MG PO TABS
50.0000 mg | ORAL_TABLET | Freq: Every day | ORAL | Status: DC
  Administered 2015-12-25: 50 mg via ORAL
  Filled 2015-12-24: qty 1

## 2015-12-24 MED ORDER — VITAMIN B-12 1000 MCG PO TABS
1000.0000 ug | ORAL_TABLET | Freq: Every day | ORAL | Status: DC
  Administered 2015-12-25: 1000 ug via ORAL
  Filled 2015-12-24: qty 1

## 2015-12-24 MED ORDER — NITROGLYCERIN 0.4 MG SL SUBL: 0.4000 mg | SUBLINGUAL_TABLET | SUBLINGUAL | Status: DC | PRN

## 2015-12-24 MED ORDER — HEPARIN BOLUS VIA INFUSION
4000.0000 [IU] | Freq: Once | INTRAVENOUS | Status: AC
  Administered 2015-12-25: 4000 [IU] via INTRAVENOUS
  Filled 2015-12-24: qty 4000

## 2015-12-24 MED ORDER — ALLOPURINOL 300 MG PO TABS
300.0000 mg | ORAL_TABLET | Freq: Every day | ORAL | Status: DC
  Administered 2015-12-25: 300 mg via ORAL
  Filled 2015-12-24 (×2): qty 1

## 2015-12-24 MED ORDER — ASPIRIN 81 MG PO CHEW
324.0000 mg | CHEWABLE_TABLET | ORAL | Status: DC
  Filled 2015-12-24: qty 4

## 2015-12-24 MED ORDER — CYCLOBENZAPRINE HCL 10 MG PO TABS: 5.0000 mg | ORAL_TABLET | Freq: Three times a day (TID) | ORAL | Status: DC | PRN

## 2015-12-24 MED ORDER — SERTRALINE HCL 50 MG PO TABS
50.0000 mg | ORAL_TABLET | Freq: Every day | ORAL | Status: DC
  Administered 2015-12-25: 50 mg via ORAL
  Filled 2015-12-24 (×2): qty 1

## 2015-12-24 MED ORDER — SODIUM CHLORIDE 0.9 % IV SOLN: 250.0000 mL | INTRAVENOUS | Status: DC | PRN

## 2015-12-24 MED ORDER — ONDANSETRON HCL 4 MG/2ML IJ SOLN: 4.0000 mg | Freq: Four times a day (QID) | INTRAMUSCULAR | Status: DC | PRN

## 2015-12-24 MED ORDER — SODIUM CHLORIDE 0.9% FLUSH: 3.0000 mL | INTRAVENOUS | Status: DC | PRN

## 2015-12-24 MED ORDER — AMLODIPINE BESYLATE 5 MG PO TABS
5.0000 mg | ORAL_TABLET | Freq: Every day | ORAL | Status: DC
  Administered 2015-12-25: 5 mg via ORAL
  Filled 2015-12-24 (×2): qty 1

## 2015-12-24 MED ORDER — PANTOPRAZOLE SODIUM 40 MG PO TBEC
40.0000 mg | DELAYED_RELEASE_TABLET | Freq: Every day | ORAL | Status: DC
  Administered 2015-12-25: 40 mg via ORAL
  Filled 2015-12-24 (×2): qty 1

## 2015-12-24 MED ORDER — SODIUM CHLORIDE 0.9 % IV BOLUS (SEPSIS)
500.0000 mL | Freq: Once | INTRAVENOUS | Status: AC
  Administered 2015-12-24: 500 mL via INTRAVENOUS

## 2015-12-24 MED ORDER — CARVEDILOL 3.125 MG PO TABS
3.1250 mg | ORAL_TABLET | Freq: Two times a day (BID) | ORAL | Status: DC
  Filled 2015-12-24 (×3): qty 1
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
## 2015-12-24 NOTE — ED Notes (Signed)
MD at bedside. 
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
## 2015-12-24 NOTE — Progress Notes (Signed)
ANTICOAGULATION CONSULT NOTE - Initial Consult  Pharmacy Consult for Heparin  Indication: chest pain/ACS/STEMI  Allergies  Allergen Reactions  . Chocolate Palpitations    Patient Measurements: Patient reported weight: 250 lbs (113.6 kg) --  Wt 116kg (09/2013) Height: 64 inches (37ft 4in) Heparin Dosing Weight: 85.8 kg  Vital Signs: Temp: 98.3 F (36.8 C) (04/30 1705) Temp Source: Oral (04/30 1705) BP: 111/47 mmHg (04/30 2030) Pulse Rate: 62 (04/30 2030)  Labs:  Recent Labs  12/24/15 1702  HGB 12.6  HCT 38.3  PLT 210  CREATININE 1.61*    CrCl cannot be calculated (Unknown ideal weight.).   Medical History: Past Medical History  Diagnosis Date  . Coronary artery disease   . Hypertension   . Arthritis     Medications:   (Not in a hospital admission) Scheduled:  . allopurinol  300 mg Oral Daily  . amLODipine  5 mg Oral Daily  . aspirin  324 mg Oral NOW   Or  . aspirin  300 mg Rectal NOW  . [START ON 12/25/2015] aspirin EC  81 mg Oral Daily  . [START ON 12/25/2015] atorvastatin  40 mg Oral q1800  . [START ON 12/25/2015] carvedilol  3.125 mg Oral BID WC  . LORazepam  0.5 mg Oral BID  . losartan  50 mg Oral Daily  . pantoprazole  40 mg Oral Daily  . sertraline  50 mg Oral Daily  . sodium chloride flush  3 mL Intravenous Q12H  . traMADol  50 mg Oral BID  . vitamin B-12  1,000 mcg Oral Daily    Assessment: 75 y.o female with angina, obesity history nonsmoker presents to ED 12/24/15 after one hour episode of chest pressure balloon sensation at noon lasting one hour.  Pharmacy consulted to start IV heparin for ACS/STEMI.  Not on anticoagulation PTA.   Goal of Therapy:  Heparin level 0.3-0.7 units/ml Monitor platelets by anticoagulation protocol: Yes    Plan:  Heparin 4000 units IV bolus Heparin drip 1200 units/hr Draw heparin level 8 hours after started Daily heparin level and CBC.   Thank you for allowing pharmacy to be part of this patients care  team. Nicole Cella, RPh Clinical Pharmacist Pager: [phone] 12/24/2015,9:32 PM

## 2015-12-24 NOTE — ED Notes (Signed)
RN attempted to call report to floor; RN to call back  

## 2015-12-24 NOTE — ED Notes (Signed)
Pt reports having an episode of mid chest tightness today with diaphoresis and feeling lightheaded. ekg done. Airway intact.

## 2015-12-25 ENCOUNTER — Inpatient Hospital Stay (HOSPITAL_COMMUNITY): Payer: Medicare Other

## 2015-12-25 LAB — CBC
HEMATOCRIT: 33.9 % — AB (ref 36.0–46.0)
Hemoglobin: 11.2 g/dL — ABNORMAL LOW (ref 12.0–15.0)
MCH: 30.1 pg (ref 26.0–34.0)
MCHC: 33 g/dL (ref 30.0–36.0)
MCV: 91.1 fL (ref 78.0–100.0)
Platelets: 167 10*3/uL (ref 150–400)
RBC: 3.72 MIL/uL — ABNORMAL LOW (ref 3.87–5.11)
RDW: 15.6 % — AB (ref 11.5–15.5)
WBC: 8.7 10*3/uL (ref 4.0–10.5)

## 2015-12-25 LAB — BASIC METABOLIC PANEL
Anion gap: 10 (ref 5–15)
BUN: 32 mg/dL — AB (ref 6–20)
CO2: 21 mmol/L — ABNORMAL LOW (ref 22–32)
CREATININE: 1.07 mg/dL — AB (ref 0.44–1.00)
Calcium: 9.5 mg/dL (ref 8.9–10.3)
Chloride: 111 mmol/L (ref 101–111)
GFR calc Af Amer: 57 mL/min — ABNORMAL LOW (ref >60)
GFR, EST NON AFRICAN AMERICAN: 49 mL/min — AB (ref >60)
GLUCOSE: 109 mg/dL — AB (ref 65–99)
Potassium: 4.3 mmol/L (ref 3.5–5.1)
SODIUM: 142 mmol/L (ref 135–145)

## 2015-12-25 LAB — LIPID PANEL
CHOLESTEROL: 141 mg/dL (ref 0–200)
HDL: 45 mg/dL (ref >40)
LDL Cholesterol: 87 mg/dL (ref 0–99)
Total CHOL/HDL Ratio: 3.1 RATIO
Triglycerides: 44 mg/dL (ref <150)
VLDL: 9 mg/dL (ref 0–40)

## 2015-12-25 LAB — PROTIME-INR
INR: 1.2 (ref 0.00–1.49)
Prothrombin Time: 15.4 seconds — ABNORMAL HIGH (ref 11.6–15.2)

## 2015-12-25 LAB — HEPARIN LEVEL (UNFRACTIONATED): Heparin Unfractionated: 0.75 IU/mL — ABNORMAL HIGH (ref 0.30–0.70)

## 2015-12-25 LAB — TROPONIN I

## 2015-12-25 MED ORDER — REGADENOSON 0.4 MG/5ML IV SOLN
INTRAVENOUS | Status: AC
  Administered 2015-12-25: 0.4 mg via INTRAVENOUS
  Filled 2015-12-25: qty 5

## 2015-12-25 MED ORDER — TECHNETIUM TC 99M SESTAMIBI GENERIC - CARDIOLITE: 30.0000 | Freq: Once | INTRAVENOUS | Status: DC | PRN

## 2015-12-25 MED ORDER — TECHNETIUM TC 99M SESTAMIBI - CARDIOLITE
10.0000 | Freq: Once | INTRAVENOUS | Status: AC | PRN
  Administered 2015-12-25: 10 via INTRAVENOUS

## 2015-12-25 MED ORDER — ATORVASTATIN CALCIUM 20 MG PO TABS: 20.0000 mg | ORAL_TABLET | Freq: Every day | ORAL | Status: AC

## 2015-12-25 MED ORDER — REGADENOSON 0.4 MG/5ML IV SOLN
0.4000 mg | Freq: Once | INTRAVENOUS | Status: AC
Start: 2015-12-25 — End: 2015-12-25
  Administered 2015-12-25: 0.4 mg via INTRAVENOUS
  Filled 2015-12-25: qty 5

## 2015-12-25 NOTE — Progress Notes (Signed)
Pt in stable condition, this RN went over discharge instructions with pt, pt verbalised understanding, iv taken out, tele dc ccmd notified, pt belongings at bedside

## 2015-12-25 NOTE — Discharge Summary (Signed)
Discharge summary dictated on 12/25/2015, dictation number is [phone]

## 2015-12-25 NOTE — Progress Notes (Signed)
[place] for Heparin  Indication: chest pain/ACS/STEMI  Allergies  Allergen Reactions  . Chocolate Palpitations    Patient Measurements: Patient reported weight: 250 lbs (113.6 kg) --  Wt 116kg (09/2013) Height: 64 inches (32ft 4in) Heparin Dosing Weight: 85.8 kg  Vital Signs: Temp: 97.5 F (36.4 C) (05/01 1114) Temp Source: Oral (05/01 1114) BP: 122/51 mmHg (05/01 1114) Pulse Rate: 53 (05/01 1114)  Labs:  Recent Labs  12/24/15 1702 12/24/15 2105 12/25/15 0320 12/25/15 1139  HGB 12.6  --  11.2*  --   HCT 38.3  --  33.9*  --   PLT 210  --  167  --   LABPROT  --   --  15.4*  --   INR  --   --  1.20  --   HEPARINUNFRC  --   --   --  0.75*  CREATININE 1.61*  --  1.07*  --   TROPONINI  --  <0.03 <0.03 <0.03    Estimated Creatinine Clearance: 54.3 mL/min (by C-G formula based on Cr of 1.07).   Medical History: Past Medical History  Diagnosis Date  . Coronary artery disease   . Hypertension   . Arthritis     Assessment: 75 y.o female with angina, obesity history nonsmoker presents to ED 12/24/15 after one hour episode of chest pressure balloon sensation at noon lasting one hour.  Pharmacy consulted to start IV heparin for ACS/STEMI.  Not on anticoagulation PTA.  Initial heparin level delayed due to procedures/out of room this am. Heparin level came back slightly elevated at 0.75. No bleeding issues noted, cbc down slightly.  Goal of Therapy:  Heparin level 0.3-0.7 units/ml Monitor platelets by anticoagulation protocol: Yes    Plan:  Decrease Heparin drip to 1000 units/hr Draw heparin level 8 hours after adjustment Daily heparin level and CBC.   Thank you for allowing pharmacy to be part of this patients care team. Erin Hearing PharmD., BCPS Clinical Pharmacist Pager [phone] 12/25/2015 2:16 PM

## 2015-12-25 NOTE — Discharge Instructions (Signed)

## 2015-12-26 NOTE — Progress Notes (Signed)
Utilization review completed- post discharge 

## 2015-12-26 NOTE — Discharge Summary (Signed)
NAMESHANIN, PARRILLI NO.:  [id number]  MEDICAL RECORD NO.:  [id number]  LOCATION:  2W26C                        FACILITY:  [facility]  PHYSICIAN:  Allegra Lai. Terrence Dupont, M.D. DATE OF BIRTH:  [birth year]  DATE OF ADMISSION:  12/24/2015 DATE OF DISCHARGE:  12/25/2015                              DISCHARGE SUMMARY   ADMITTING DIAGNOSES: 1. Acute coronary syndrome. 2. Acute renal failure. 3. Hypertension. 4. Obesity. 5. Arthritis.  FINAL DIAGNOSES: 1. Status post chest pain, myocardial infarction ruled out.  Negative     nuclear stress test. 2. Hypertension. 3. Status post acute renal failure. 4. Morbid obesity. 5. Degenerative joint disease. 6. Gouty arthritis. 7. Glucose intolerance. 8. Hyperlipidemia.  DISCHARGE HOME MEDICATIONS: 1. Atorvastatin 20 mg 1 tablet daily. 2. Allopurinol 300 mg 1 tablet daily. 3. Amlodipine 5 mg 1 tablet daily. 4. Aspirin 81 mg 1 tablet daily. 5. Diphenhydramine 25 mg every 6 hours as needed. 6. Losartan 50 mg 1 tablet daily. 7. Nitrostat 0.4 mg sublingual use as directed. 8. Omeprazole 20 mg daily. 9. Zoloft 50 mg daily. 10.Vitamin B12 1000 mcg daily. 11.Omega-3 1000 mg 1 capsule daily.  DIET:  Low-salt, low-cholesterol 1800 calories ADA weight reducing diet.  FOLLOWUP:  Follow up with me in 1 week.  CONDITION AT DISCHARGE:  Stable.  BRIEF HISTORY AND HOSPITAL COURSE:  Shelby Meza is a 75 year old female with past medical history significant for angina, morbid obesity, history of smoking in the past, presented to the ER with 1 hour episode of chest pressure described as balloon sensation at noon lasting 1 hour. No history of similar pain in the past.  The patient did have sweaty arms.  The patient currently has no symptoms when seen in the ED.  The patient states she has been off her medications for 1 week because she was in [place].  The patient had aspirin today.  No fever, cough.  No nausea, vomiting, or diarrhea.   The patient had cardiac cath approximately 2 years ago which showed minimal nonobstructive coronary artery disease with normal LV systolic function.  PHYSICAL EXAMINATION:  VITAL SIGNS:  Her blood pressure was 111/47, pulse 62, she was afebrile. HEENT:  Conjunctivae were pink. NECK:  Supple.  No JVD.  No bruit. LUNGS:  Clear to auscultation without rhonchi or rales. CARDIOVASCULAR: S1, S2 was normal.  Regular rate and rhythm.  A 2/6 systolic murmur was noted. ABDOMEN:  Soft.  Bowel sounds were present.  Nontender. EXTREMITIES:  There is no clubbing, cyanosis, or edema. NEUROLOGIC:  Grossly intact.  LABORATORY DATA:  Sodium was 142, potassium 4.4, BUN 35, creatinine 1.61, glucose was 100.  Repeat BUN was 32, creatinine 1.07.  Three sets of troponin-I were negative.  Cholesterol was 141, triglycerides 44, LDL 87, HDL 45.  Hemoglobin was 12.6, hematocrit 38.3, white count of 8.2. Chest x-ray showed mild cardiomegaly, shallow inspiration.  Nuclear stress test showed no evidence of reversible ischemia or infarction, EF 56%.  EKG showed normal sinus rhythm, left anterior fascicular block. No acute ischemic changes were noted.  BRIEF HOSPITAL COURSE:  The patient was admitted to telemetry unit by Dr. Dixie Dials.  MI was ruled out by serial enzymes  and EKG.  The patient did not have any episodes of chest pain during the hospital stay.  The patient was slowly hydrated with normalization of her renal function.  The patient is ambulating in room without any problems.  The patient will be discharged home on above medications and will be followed up in my office in 1 week.  The patient has been advised to stay off any NSAIDs.     Allegra Lai. Terrence Dupont, M.D.     MNH/MEDQ  D:  12/25/2015  T:  12/26/2015  Job:  [id number]

## 2016-01-10 ENCOUNTER — Other Ambulatory Visit: Payer: Self-pay

## 2016-01-10 DIAGNOSIS — Z1231 Encounter for screening mammogram for malignant neoplasm of breast: Secondary | ICD-10-CM

## 2016-02-01 ENCOUNTER — Ambulatory Visit
Admission: RE | Admit: 2016-02-01 | Discharge: 2016-02-01 | Disposition: A | Discharge status: Home / Self Care | Payer: Medicare Other | Source: Ambulatory Visit

## 2016-02-01 ENCOUNTER — Other Ambulatory Visit: Payer: Self-pay | Admitting: Cardiology

## 2016-02-01 DIAGNOSIS — Z1231 Encounter for screening mammogram for malignant neoplasm of breast: Secondary | ICD-10-CM

## 2016-12-07 ENCOUNTER — Observation Stay (HOSPITAL_COMMUNITY)
Admission: EM | Admit: 2016-12-07 | Discharge: 2016-12-08 | Disposition: A | Discharge status: Home / Self Care | Payer: Medicare Other | Attending: Cardiology | Admitting: Cardiology

## 2016-12-07 ENCOUNTER — Emergency Department (HOSPITAL_COMMUNITY): Payer: Medicare Other

## 2016-12-07 ENCOUNTER — Encounter (HOSPITAL_COMMUNITY): Payer: Self-pay | Admitting: *Deleted

## 2016-12-07 DIAGNOSIS — Z8249 Family history of ischemic heart disease and other diseases of the circulatory system: Secondary | ICD-10-CM | POA: Diagnosis not present

## 2016-12-07 DIAGNOSIS — Z7982 Long term (current) use of aspirin: Secondary | ICD-10-CM | POA: Diagnosis not present

## 2016-12-07 DIAGNOSIS — K219 Gastro-esophageal reflux disease without esophagitis: Secondary | ICD-10-CM | POA: Insufficient documentation

## 2016-12-07 DIAGNOSIS — R0789 Other chest pain: Secondary | ICD-10-CM

## 2016-12-07 DIAGNOSIS — Z6841 Body Mass Index (BMI) 40.0 and over, adult: Secondary | ICD-10-CM | POA: Insufficient documentation

## 2016-12-07 DIAGNOSIS — I208 Other forms of angina pectoris: Principal | ICD-10-CM | POA: Insufficient documentation

## 2016-12-07 DIAGNOSIS — Z79899 Other long term (current) drug therapy: Secondary | ICD-10-CM | POA: Diagnosis not present

## 2016-12-07 DIAGNOSIS — R7303 Prediabetes: Secondary | ICD-10-CM | POA: Diagnosis not present

## 2016-12-07 DIAGNOSIS — I249 Acute ischemic heart disease, unspecified: Secondary | ICD-10-CM | POA: Diagnosis present

## 2016-12-07 DIAGNOSIS — R079 Chest pain, unspecified: Secondary | ICD-10-CM | POA: Diagnosis present

## 2016-12-07 DIAGNOSIS — I252 Old myocardial infarction: Secondary | ICD-10-CM | POA: Diagnosis not present

## 2016-12-07 DIAGNOSIS — E785 Hyperlipidemia, unspecified: Secondary | ICD-10-CM | POA: Insufficient documentation

## 2016-12-07 DIAGNOSIS — I10 Essential (primary) hypertension: Secondary | ICD-10-CM | POA: Insufficient documentation

## 2016-12-07 LAB — I-STAT TROPONIN, ED: TROPONIN I, POC: 0.01 ng/mL (ref 0.00–0.08)

## 2016-12-07 LAB — CBC
HCT: 39.3 % (ref 36.0–46.0)
Hemoglobin: 12.6 g/dL (ref 12.0–15.0)
MCH: 28.5 pg (ref 26.0–34.0)
MCHC: 32.1 g/dL (ref 30.0–36.0)
MCV: 88.9 fL (ref 78.0–100.0)
PLATELETS: 222 10*3/uL (ref 150–400)
RBC: 4.42 MIL/uL (ref 3.87–5.11)
RDW: 16 % — ABNORMAL HIGH (ref 11.5–15.5)
WBC: 7.2 10*3/uL (ref 4.0–10.5)

## 2016-12-07 LAB — BASIC METABOLIC PANEL
Anion gap: 7 (ref 5–15)
BUN: 22 mg/dL — AB (ref 6–20)
CALCIUM: 9.5 mg/dL (ref 8.9–10.3)
CHLORIDE: 109 mmol/L (ref 101–111)
CO2: 24 mmol/L (ref 22–32)
CREATININE: 0.96 mg/dL (ref 0.44–1.00)
GFR calc Af Amer: >60 mL/min (ref >60)
GFR calc non Af Amer: 56 mL/min — ABNORMAL LOW (ref >60)
GLUCOSE: 141 mg/dL — AB (ref 65–99)
Potassium: 3.8 mmol/L (ref 3.5–5.1)
Sodium: 140 mmol/L (ref 135–145)

## 2016-12-07 LAB — D-DIMER, QUANTITATIVE: D-Dimer, Quant: 0.58 ug/mL-FEU — ABNORMAL HIGH (ref 0.00–0.50)

## 2016-12-07 LAB — TROPONIN I

## 2016-12-07 LAB — TSH: TSH: 0.726 u[IU]/mL (ref 0.350–4.500)

## 2016-12-07 MED ORDER — MUSCLE RUB 10-15 % EX CREA
TOPICAL_CREAM | CUTANEOUS | Status: DC | PRN
  Administered 2016-12-07: 1 via TOPICAL
  Filled 2016-12-07: qty 85

## 2016-12-07 MED ORDER — METOPROLOL TARTRATE 25 MG PO TABS
12.5000 mg | ORAL_TABLET | Freq: Two times a day (BID) | ORAL | Status: DC
  Administered 2016-12-07 – 2016-12-08 (×2): 12.5 mg via ORAL
  Filled 2016-12-07 (×3): qty 1

## 2016-12-07 MED ORDER — ASPIRIN EC 81 MG PO TBEC
81.0000 mg | DELAYED_RELEASE_TABLET | Freq: Every day | ORAL | Status: DC
  Administered 2016-12-08: 81 mg via ORAL
  Filled 2016-12-07: qty 1

## 2016-12-07 MED ORDER — ONDANSETRON HCL 4 MG/2ML IJ SOLN: 4.0000 mg | Freq: Four times a day (QID) | INTRAMUSCULAR | Status: DC | PRN

## 2016-12-07 MED ORDER — AMLODIPINE BESYLATE 5 MG PO TABS
5.0000 mg | ORAL_TABLET | Freq: Every day | ORAL | Status: DC
  Administered 2016-12-07 – 2016-12-08 (×2): 5 mg via ORAL
  Filled 2016-12-07 (×2): qty 1

## 2016-12-07 MED ORDER — LORAZEPAM 1 MG PO TABS: 1.0000 mg | ORAL_TABLET | Freq: Every evening | ORAL | Status: DC | PRN

## 2016-12-07 MED ORDER — OMEGA-3-ACID ETHYL ESTERS 1 G PO CAPS
1000.0000 mg | ORAL_CAPSULE | Freq: Every day | ORAL | Status: DC
  Administered 2016-12-07 – 2016-12-08 (×2): 1000 mg via ORAL
  Filled 2016-12-07 (×2): qty 1

## 2016-12-07 MED ORDER — NITROGLYCERIN 2 % TD OINT
0.5000 [in_us] | TOPICAL_OINTMENT | Freq: Four times a day (QID) | TRANSDERMAL | Status: DC
  Administered 2016-12-07 – 2016-12-08 (×5): 0.5 [in_us] via TOPICAL
  Filled 2016-12-07: qty 30

## 2016-12-07 MED ORDER — CYCLOBENZAPRINE HCL 10 MG PO TABS: 10.0000 mg | ORAL_TABLET | Freq: Three times a day (TID) | ORAL | Status: DC | PRN

## 2016-12-07 MED ORDER — ALLOPURINOL 300 MG PO TABS
300.0000 mg | ORAL_TABLET | Freq: Every day | ORAL | Status: DC
  Administered 2016-12-07 – 2016-12-08 (×2): 300 mg via ORAL
  Filled 2016-12-07 (×2): qty 1

## 2016-12-07 MED ORDER — VITAMIN B-12 1000 MCG PO TABS
1000.0000 ug | ORAL_TABLET | Freq: Every day | ORAL | Status: DC
  Administered 2016-12-07 – 2016-12-08 (×2): 1000 ug via ORAL
  Filled 2016-12-07 (×2): qty 1

## 2016-12-07 MED ORDER — ASPIRIN 81 MG PO CHEW: 324.0000 mg | CHEWABLE_TABLET | ORAL | Status: AC

## 2016-12-07 MED ORDER — ATORVASTATIN CALCIUM 20 MG PO TABS
20.0000 mg | ORAL_TABLET | Freq: Every day | ORAL | Status: DC
  Administered 2016-12-07 – 2016-12-08 (×2): 20 mg via ORAL
  Filled 2016-12-07 (×2): qty 1

## 2016-12-07 MED ORDER — NITROGLYCERIN 0.4 MG SL SUBL
0.4000 mg | SUBLINGUAL_TABLET | SUBLINGUAL | Status: DC | PRN
  Filled 2016-12-07: qty 1

## 2016-12-07 MED ORDER — ASPIRIN 300 MG RE SUPP: 300.0000 mg | RECTAL | Status: AC

## 2016-12-07 MED ORDER — HEPARIN (PORCINE) IN NACL 100-0.45 UNIT/ML-% IJ SOLN
1100.0000 [IU]/h | INTRAMUSCULAR | Status: DC
  Administered 2016-12-07: 1000 [IU]/h via INTRAVENOUS
  Administered 2016-12-08: 1100 [IU]/h via INTRAVENOUS
  Filled 2016-12-07 (×2): qty 250

## 2016-12-07 MED ORDER — HEPARIN BOLUS VIA INFUSION
4000.0000 [IU] | Freq: Once | INTRAVENOUS | Status: AC
  Administered 2016-12-07: 4000 [IU] via INTRAVENOUS
  Filled 2016-12-07: qty 4000

## 2016-12-07 MED ORDER — TRAMADOL HCL 50 MG PO TABS
50.0000 mg | ORAL_TABLET | Freq: Three times a day (TID) | ORAL | Status: DC | PRN
  Administered 2016-12-07 – 2016-12-08 (×3): 50 mg via ORAL
  Filled 2016-12-07 (×4): qty 1

## 2016-12-07 MED ORDER — SERTRALINE HCL 50 MG PO TABS
50.0000 mg | ORAL_TABLET | Freq: Every day | ORAL | Status: DC
  Administered 2016-12-07 – 2016-12-08 (×2): 50 mg via ORAL
  Filled 2016-12-07 (×2): qty 1

## 2016-12-07 MED ORDER — SODIUM CHLORIDE 0.9 % IV SOLN
INTRAVENOUS | Status: DC
  Administered 2016-12-07: 17:00:00 via INTRAVENOUS

## 2016-12-07 MED ORDER — ASPIRIN 81 MG PO CHEW
324.0000 mg | CHEWABLE_TABLET | Freq: Once | ORAL | Status: AC
Start: 2016-12-07 — End: 2016-12-07
  Administered 2016-12-07: 324 mg via ORAL
  Filled 2016-12-07: qty 4

## 2016-12-07 MED ORDER — NITROGLYCERIN 0.4 MG SL SUBL: 0.4000 mg | SUBLINGUAL_TABLET | SUBLINGUAL | Status: DC | PRN

## 2016-12-07 MED ORDER — ACETAMINOPHEN 325 MG PO TABS
650.0000 mg | ORAL_TABLET | ORAL | Status: DC | PRN
  Administered 2016-12-07: 650 mg via ORAL
  Filled 2016-12-07: qty 2

## 2016-12-07 NOTE — ED Notes (Signed)
Pt not in lobby, is on way to xray.

## 2016-12-07 NOTE — ED Notes (Signed)
Lab to add on Ddimer 

## 2016-12-07 NOTE — ED Notes (Signed)
Denies CP.
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
## 2016-12-07 NOTE — Progress Notes (Signed)
ANTICOAGULATION CONSULT NOTE - Initial Consult  Pharmacy Consult for heparin Indication: chest pain/ACS  Allergies  Allergen Reactions  . Chocolate Palpitations    Patient Measurements: Height: 5\' 6"  (167.6 cm) Weight: 250 lb (113.4 kg) IBW/kg (Calculated) : 59.3 Heparin Dosing Weight: 86kg  Vital Signs: Temp: 98.4 F (36.9 C) (04/14 1211) Temp Source: Oral (04/14 1211) BP: 135/75 (04/14 1241) Pulse Rate: 74 (04/14 1242)  Labs:  Recent Labs  12/07/16 1207  HGB 12.6  HCT 39.3  PLT 222  CREATININE 0.96    Estimated Creatinine Clearance: 62.7 mL/min (by C-G formula based on SCr of 0.96 mg/dL).   Medical History: Past Medical History:  Diagnosis Date  . Arthritis   . Coronary artery disease   . Hypertension      Assessment: 76 yo female here with CP. Pharmacy consulted to dose heparin. No anticoagulants noted PTA.   Goal of Therapy:  Heparin level 0.3-0.7 units/ml Monitor platelets by anticoagulation protocol: Yes   Plan:  -Heparin bolus 4000 units IV followed by 1000 units/hr (~ 12 units/kg/hr) -Heparin level in 8 hours and daily wth CBC daily  Hildred Laser, Pharm D 12/07/2016 3:29 PM
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
## 2016-12-07 NOTE — ED Triage Notes (Signed)
Pt reports bil chest pain that radiates to upper bil back onset today while sitting, pt reports dizziness with episode, pt denies SOB, n/v/d, pt hx of MI, A&O x4

## 2016-12-08 ENCOUNTER — Observation Stay (HOSPITAL_COMMUNITY): Payer: Medicare Other

## 2016-12-08 DIAGNOSIS — R7303 Prediabetes: Secondary | ICD-10-CM | POA: Diagnosis not present

## 2016-12-08 DIAGNOSIS — I208 Other forms of angina pectoris: Secondary | ICD-10-CM | POA: Diagnosis not present

## 2016-12-08 DIAGNOSIS — I10 Essential (primary) hypertension: Secondary | ICD-10-CM | POA: Diagnosis not present

## 2016-12-08 LAB — BASIC METABOLIC PANEL
Anion gap: 7 (ref 5–15)
BUN: 19 mg/dL (ref 6–20)
CO2: 27 mmol/L (ref 22–32)
Calcium: 9.5 mg/dL (ref 8.9–10.3)
Chloride: 106 mmol/L (ref 101–111)
Creatinine, Ser: 0.91 mg/dL (ref 0.44–1.00)
GFR, EST NON AFRICAN AMERICAN: 59 mL/min — AB (ref >60)
Glucose, Bld: 98 mg/dL (ref 65–99)
Potassium: 3.8 mmol/L (ref 3.5–5.1)
SODIUM: 140 mmol/L (ref 135–145)

## 2016-12-08 LAB — CBC
HCT: 35.9 % — ABNORMAL LOW (ref 36.0–46.0)
Hemoglobin: 11.7 g/dL — ABNORMAL LOW (ref 12.0–15.0)
MCH: 29 pg (ref 26.0–34.0)
MCHC: 32.6 g/dL (ref 30.0–36.0)
MCV: 88.9 fL (ref 78.0–100.0)
Platelets: 228 10*3/uL (ref 150–400)
RBC: 4.04 MIL/uL (ref 3.87–5.11)
RDW: 15.9 % — AB (ref 11.5–15.5)
WBC: 9.4 10*3/uL (ref 4.0–10.5)

## 2016-12-08 LAB — LIPID PANEL
CHOL/HDL RATIO: 2.3 ratio
CHOLESTEROL: 98 mg/dL (ref 0–200)
HDL: 42 mg/dL (ref >40)
LDL Cholesterol: 42 mg/dL (ref 0–99)
Triglycerides: 71 mg/dL (ref <150)
VLDL: 14 mg/dL (ref 0–40)

## 2016-12-08 LAB — TROPONIN I

## 2016-12-08 LAB — HEPARIN LEVEL (UNFRACTIONATED)
HEPARIN UNFRACTIONATED: 0.28 [IU]/mL — AB (ref 0.30–0.70)
Heparin Unfractionated: 0.32 IU/mL (ref 0.30–0.70)
Heparin Unfractionated: 1.68 IU/mL — ABNORMAL HIGH (ref 0.30–0.70)

## 2016-12-08 LAB — HEMOGLOBIN A1C
HEMOGLOBIN A1C: 5.9 % — AB (ref 4.8–5.6)
MEAN PLASMA GLUCOSE: 123 mg/dL

## 2016-12-08 MED ORDER — REGADENOSON 0.4 MG/5ML IV SOLN
0.4000 mg | Freq: Once | INTRAVENOUS | Status: AC
Start: 2016-12-08 — End: 2016-12-08
  Administered 2016-12-08: 0.4 mg via INTRAVENOUS
  Filled 2016-12-08: qty 5

## 2016-12-08 MED ORDER — TECHNETIUM TC 99M TETROFOSMIN IV KIT
10.0000 | PACK | Freq: Once | INTRAVENOUS | Status: AC | PRN
  Administered 2016-12-08: 10 via INTRAVENOUS

## 2016-12-08 MED ORDER — REGADENOSON 0.4 MG/5ML IV SOLN
INTRAVENOUS | Status: AC
  Filled 2016-12-08: qty 5

## 2016-12-08 MED ORDER — TECHNETIUM TC 99M TETROFOSMIN IV KIT
30.0000 | PACK | Freq: Once | INTRAVENOUS | Status: AC | PRN
  Administered 2016-12-08: 30 via INTRAVENOUS

## 2016-12-08 NOTE — Discharge Summary (Signed)
Shelby Meza, Shelby Meza NO.:  [id number]  MEDICAL RECORD NO.:  [id number]  LOCATION:  2W03C                        FACILITY:  [facility]  PHYSICIAN:  Allegra Lai. Terrence Dupont, M.D. DATE OF BIRTH:  [birth year]  DATE OF ADMISSION:  12/07/2016 DATE OF DISCHARGE:  12/08/2016                              DISCHARGE SUMMARY   ADMITTING DIAGNOSES: 1. Acute coronary syndrome, rule out myocardial infarction. 2. Coronary artery disease. 3. Hypertension. 4. Prediabetic. 5. Morbid obesity. 6. Hyperlipidemia. 7. Positive family history of coronary artery disease. 8. Gastroesophageal reflux disease.  FINAL DIAGNOSES: 1. Stable angina, myocardial infarction ruled out.  Negative nuclear     stress test. 2. Coronary artery disease. 3. Hypertension. 4. Prediabetic. 5. Morbid obesity. 6. Hyperlipidemia. 7. Positive family history of coronary artery disease. 8. Gastroesophageal reflux disease.  DISCHARGE MEDICATIONS:  Discharge home medications are the same medications i.e. 1. Allopurinol 300 mg 1 tablet daily. 2. Amlodipine 5 mg daily. 3. Aspirin 81 mg daily. 4. Atorvastatin 20 mg daily. 5. Ben-Gay apply locally as before. 6. Flexeril 10 mg 3 times daily as needed. 7. Benadryl 25 mg every 6 hours as needed for itching. 8. Lorazepam 1 mg daily at night. 9. Losartan 50 mg daily. 10.Multivitamin 1 tablet daily. 11.Nitrostat sublingual p.r.n. 12.Omega-3 1000 mg daily. 13.Zoloft 50 mg daily. 14.Tramadol 50 mg every 8 hours as needed. 15.Vitamin B12 1000 mcg daily.  DIET:  Low-salt low-cholesterol 1800 calories ADA diet.  The patient has been advised to avoid sweets.  CONDITION AT DISCHARGE:  Stable.  FOLLOWUP:  Follow up with me in 1 week.  BRIEF HISTORY AND HOSPITAL COURSE:  Shelby Meza is a 76 year old female with past medical history significant for coronary artery disease, history of remote MI in the past, hypertension, prediabetic, morbid obesity, GERD, history of gouty  arthritis, positive family history of coronary artery disease, father died of MI in his 50s, came to the ER complaining of retrosternal chest pressure radiating to both shoulders, associated with diaphoresis and dizziness.  States chest pressure was grade 6/10, received aspirin with partial relief.  EKG in the ED showed normal sinus rhythm with no acute ischemic changes.  First set of cardiac enzyme was negative.  The patient also gives history of exertional dyspnea, although activity is limited.  Denies PND, orthopnea, or leg swelling.  Denies palpitation, lightheadedness, or syncope.  PHYSICAL EXAMINATION:  GENERAL:  She was alert, awake, oriented x3, in no acute distress. VITAL SIGNS:  Blood pressure was 135/75, pulse 74, she was afebrile. HEENT:  Conjunctivae were pink. NECK:  Supple.  No JVD.  No bruit. LUNGS:  Clear to auscultation without rhonchi or rales. CARDIOVASCULAR:  S1, S2 normal.  There was soft systolic murmur.  No S3, gallop. ABDOMEN:  Soft.  Bowel sounds are present.  Nontender. EXTREMITIES:  There is no clubbing, cyanosis, or edema. NEUROLOGIC:  Grossly intact.  LABORATORY DATA:  Sodium was 140, potassium 3.8, BUN 22, creatinine 0.96.  Blood sugar was 141, repeat fasting blood sugar was 98.  Three sets of troponin-I were normal.  Cholesterol was 98, HDL 42, LDL 42, triglycerides 71.  Hemoglobin was 12.6, hematocrit 39.3, white count of 7.2.  Her hemoglobin  A1c was 5.9.  TSH was 0.726.  Nuclear stress test done today showed no evidence of reversible ischemia with septal hypokinesia, EF of 56%.  BRIEF HOSPITAL COURSE:  The patient was admitted to Telemetry Unit.  MI was ruled out by serial enzymes and EKG.  The patient did not have any episodes of chest pain during the hospital stay.  The patient subsequently underwent nuclear stress test as above, which showed no evidence of reversible ischemia.  The patient will be discharged home on above medications and will  be followed up in my office in 1 week.     Allegra Lai. Terrence Dupont, M.D.     MNH/MEDQ  D:  12/08/2016  T:  12/08/2016  Job:  [id number]

## 2016-12-08 NOTE — Care Management Note (Signed)
Case Management Note  Patient Details  Name: Shelby Meza MRN: [id number] Date of Birth: [birth year]  Subjective/Objective:           Pt admitted with CP         Action/Plan:   PTA from home alone but pt states she is independent - uses walker and wheelchair when needed.  Pt has PCP and daughters help pt to pay for medications.  CM will continue to follow for discharge needs   Expected Discharge Date:  12/09/16               Expected Discharge Plan:  Home/Self Care  In-House Referral:     Discharge planning Services  CM Consult  Post Acute Care Choice:    Choice offered to:     DME Arranged:    DME Agency:     HH Arranged:    HH Agency:     Status of Service:  In process, will continue to follow  If discussed at Long Length of Stay Meetings, dates discussed:    Additional Comments:  Maryclare Labrador, RN 12/08/2016, 11:25 AM

## 2016-12-08 NOTE — Progress Notes (Signed)
ANTICOAGULATION CONSULT NOTE Pharmacy Consult for heparin Indication: chest pain/ACS  Assessment: 76 y.o. female with chest pain for heparin. Heparin level this am is slightly low at 0.28 (subtherapeutic) on 1000 units/hr. No issues with line or bleeding per RN. Hemoglobin is 11.7 and platelets are wnl.   Goal of Therapy:  Heparin level 0.3-0.7 units/ml Monitor platelets by anticoagulation protocol: Yes   Plan:  Increase heparin to 1100 units/hr Follow-up 8 hour heparin level  Monitor daily heparin level and CBC   Allergies  Allergen Reactions  . Chocolate Palpitations    Patient Measurements: Height: 5\' 6"  (167.6 cm) Weight: 250 lb (113.4 kg) IBW/kg (Calculated) : 59.3 Heparin Dosing Weight: 86kg  Vital Signs: Temp: 98.5 F (36.9 C) (04/15 0621) Temp Source: Oral (04/15 0621) BP: 113/56 (04/15 0621) Pulse Rate: 57 (04/15 0621)  Labs:  Recent Labs  12/07/16 1207 12/07/16 1842 12/08/16 0020 12/08/16 0621  HGB 12.6  --   --  11.7*  HCT 39.3  --   --  35.9*  PLT 222  --   --  228  HEPARINUNFRC  --   --  0.32 0.28*  CREATININE 0.96  --   --  0.91  TROPONINI  --  <0.03 <0.03 <0.03    Estimated Creatinine Clearance: 66.1 mL/min (by C-G formula based on SCr of 0.91 mg/dL).  Belia Heman, PharmD PGY1 Pharmacy Resident [phone] (Pager) 12/08/2016 7:28 AM

## 2016-12-08 NOTE — Discharge Instructions (Signed)

## 2016-12-08 NOTE — Progress Notes (Signed)
Nadine for heparin Indication: chest pain/ACS  Allergies  Allergen Reactions  . Chocolate Palpitations    Patient Measurements: Height: 5\' 6"  (167.6 cm) Weight: 250 lb (113.4 kg) IBW/kg (Calculated) : 59.3 Heparin Dosing Weight: 86kg  Vital Signs: Temp: 98.5 F (36.9 C) (04/14 2047) Temp Source: Oral (04/14 2047) BP: 132/83 (04/14 2047) Pulse Rate: 63 (04/14 2047)  Labs:  Recent Labs  12/07/16 1207 12/07/16 1842 12/08/16 0020  HGB 12.6  --   --   HCT 39.3  --   --   PLT 222  --   --   HEPARINUNFRC  --   --  0.32  CREATININE 0.96  --   --   TROPONINI  --  <0.03 <0.03    Estimated Creatinine Clearance: 62.7 mL/min (by C-G formula based on SCr of 0.96 mg/dL).  Assessment: 76 y.o. female with chest pain for heparin   Goal of Therapy:  Heparin level 0.3-0.7 units/ml Monitor platelets by anticoagulation protocol: Yes   Plan:  Continue Heparin at current rate  Follow-up am labs.  Phillis Knack, PharmD, BCPS  12/08/2016 1:15 AM

## 2016-12-08 NOTE — Progress Notes (Signed)
Subjective:  Denies any further chest pain or diaphoresis. Doing well. 3 sets of cardiac enzymes are negative tolerated stress portion of the nuclear stress test. Myoview scan results are pending  Objective:  Vital Signs in the last 24 hours: Temp:  [98.4 F (36.9 C)-98.5 F (36.9 C)] 98.5 F (36.9 C) (04/15 0621) Pulse Rate:  [55-77] 73 (04/15 0910) Resp:  [12-18] 18 (04/15 0621) BP: (113-156)/(56-97) 138/89 (04/15 0910) SpO2:  [94 %-99 %] 94 % (04/15 0621) Weight:  [250 lb (113.4 kg)] 250 lb (113.4 kg) (04/14 1208)  Intake/Output from previous day: No intake/output data recorded. Intake/Output from this shift: No intake/output data recorded.  Physical Exam: Exam unchanged  Lab Results:  Recent Labs  12/07/16 1207 12/08/16 0621  WBC 7.2 9.4  HGB 12.6 11.7*  PLT 222 228    Recent Labs  12/07/16 1207 12/08/16 0621  NA 140 140  K 3.8 3.8  CL 109 106  CO2 24 27  GLUCOSE 141* 98  BUN 22* 19  CREATININE 0.96 0.91    Recent Labs  12/08/16 0020 12/08/16 0621  TROPONINI <0.03 <0.03   Hepatic Function Panel No results for input(s): PROT, ALBUMIN, AST, ALT, ALKPHOS, BILITOT, BILIDIR, IBILI in the last 72 hours.  Recent Labs  12/08/16 0621  CHOL 98   No results for input(s): PROTIME in the last 72 hours.  Imaging: Imaging results have been reviewed and Dg Chest 2 View  Result Date: 12/07/2016 CLINICAL DATA:  Chest pain, dizziness starting this morning EXAM: CHEST  2 VIEW COMPARISON:  12/24/2015 FINDINGS: Cardiomediastinal silhouette is stable. No infiltrate or pleural effusion. No pulmonary edema. There is right base medially atelectasis or scarring. Degenerative changes left shoulder. Mild degenerative changes thoracic spine. IMPRESSION: No infiltrate or pulmonary edema. Right base medially atelectasis or scarring. Electronically Signed   By: Lahoma Crocker M.D.   On: 12/07/2016 12:58    Cardiac Studies:  Assessment/Plan:  Acute coronary syndrome MI ruled  out Coronary artery disease Hypertension Prediabetic Morbid obesity Hyperlipidemia Positive family history of coronary artery disease GERD Plan  New present management Check Lexiscan Myoview result is negative for ischemia we'll discharge later this afternoon  LOS: 0 days    Charolette Forward 12/08/2016, 9:30 AM

## 2016-12-08 NOTE — Discharge Summary (Signed)
D/C #[id number]

## 2016-12-08 NOTE — Care Management Obs Status (Signed)
[facility] NOTIFICATION   Patient Details  Name: Shelby Meza MRN: [id number] Date of Birth: 04/01/1941   Medicare Observation Status Notification Given:       Maryclare Labrador, RN 12/08/2016, 11:24 AM

## 2017-03-24 ENCOUNTER — Other Ambulatory Visit: Payer: Self-pay | Admitting: Cardiology

## 2017-03-24 DIAGNOSIS — Z1231 Encounter for screening mammogram for malignant neoplasm of breast: Secondary | ICD-10-CM

## 2017-04-16 ENCOUNTER — Ambulatory Visit: Payer: Medicare Other

## 2017-06-20 ENCOUNTER — Ambulatory Visit
Admission: RE | Admit: 2017-06-20 | Discharge: 2017-06-20 | Disposition: A | Discharge status: Home / Self Care | Payer: Medicare Other | Source: Ambulatory Visit | Attending: Cardiology | Admitting: Cardiology

## 2017-06-20 DIAGNOSIS — Z1231 Encounter for screening mammogram for malignant neoplasm of breast: Secondary | ICD-10-CM

## 2017-10-08 ENCOUNTER — Emergency Department (HOSPITAL_COMMUNITY)
Admission: EM | Admit: 2017-10-08 | Discharge: 2017-10-08 | Disposition: A | Discharge status: Home / Self Care | Payer: Medicare Other | Attending: Emergency Medicine | Admitting: Emergency Medicine

## 2017-10-08 ENCOUNTER — Other Ambulatory Visit: Payer: Self-pay

## 2017-10-08 ENCOUNTER — Emergency Department (HOSPITAL_COMMUNITY): Payer: Medicare Other

## 2017-10-08 ENCOUNTER — Encounter (HOSPITAL_COMMUNITY): Payer: Self-pay | Admitting: Emergency Medicine

## 2017-10-08 DIAGNOSIS — R079 Chest pain, unspecified: Secondary | ICD-10-CM | POA: Diagnosis present

## 2017-10-08 DIAGNOSIS — Z7982 Long term (current) use of aspirin: Secondary | ICD-10-CM | POA: Diagnosis not present

## 2017-10-08 DIAGNOSIS — I209 Angina pectoris, unspecified: Secondary | ICD-10-CM | POA: Insufficient documentation

## 2017-10-08 DIAGNOSIS — Z79899 Other long term (current) drug therapy: Secondary | ICD-10-CM | POA: Diagnosis not present

## 2017-10-08 DIAGNOSIS — I208 Other forms of angina pectoris: Secondary | ICD-10-CM

## 2017-10-08 DIAGNOSIS — I119 Hypertensive heart disease without heart failure: Secondary | ICD-10-CM | POA: Insufficient documentation

## 2017-10-08 LAB — BASIC METABOLIC PANEL
ANION GAP: 11 (ref 5–15)
BUN: 20 mg/dL (ref 6–20)
CHLORIDE: 104 mmol/L (ref 101–111)
CO2: 24 mmol/L (ref 22–32)
Calcium: 9.4 mg/dL (ref 8.9–10.3)
Creatinine, Ser: 1.12 mg/dL — ABNORMAL HIGH (ref 0.44–1.00)
GFR calc non Af Amer: 46 mL/min — ABNORMAL LOW (ref >60)
GFR, EST AFRICAN AMERICAN: 53 mL/min — AB (ref >60)
Glucose, Bld: 103 mg/dL — ABNORMAL HIGH (ref 65–99)
POTASSIUM: 4.2 mmol/L (ref 3.5–5.1)
Sodium: 139 mmol/L (ref 135–145)

## 2017-10-08 LAB — I-STAT TROPONIN, ED
TROPONIN I, POC: 0.02 ng/mL (ref 0.00–0.08)
TROPONIN I, POC: 0.02 ng/mL (ref 0.00–0.08)

## 2017-10-08 LAB — CBC
HCT: 39 % (ref 36.0–46.0)
Hemoglobin: 12.7 g/dL (ref 12.0–15.0)
MCH: 29.8 pg (ref 26.0–34.0)
MCHC: 32.6 g/dL (ref 30.0–36.0)
MCV: 91.5 fL (ref 78.0–100.0)
PLATELETS: 215 10*3/uL (ref 150–400)
RBC: 4.26 MIL/uL (ref 3.87–5.11)
RDW: 16.1 % — AB (ref 11.5–15.5)
WBC: 7.1 10*3/uL (ref 4.0–10.5)

## 2017-10-08 MED ORDER — MORPHINE SULFATE (PF) 4 MG/ML IV SOLN
4.0000 mg | Freq: Once | INTRAVENOUS | Status: AC
Start: 2017-10-08 — End: 2017-10-08
  Administered 2017-10-08: 4 mg via INTRAVENOUS
  Filled 2017-10-08: qty 1

## 2017-10-08 MED ORDER — NITROGLYCERIN 0.4 MG SL SUBL: 0.4000 mg | SUBLINGUAL_TABLET | SUBLINGUAL | Status: DC | PRN

## 2017-10-08 MED ORDER — ASPIRIN 81 MG PO CHEW
324.0000 mg | CHEWABLE_TABLET | Freq: Once | ORAL | Status: AC
  Administered 2017-10-08: 324 mg via ORAL
  Filled 2017-10-08: qty 4

## 2017-10-08 NOTE — ED Triage Notes (Signed)
Pt c/o 7/10 cp radiating to neck, arm and leg. No nausea or vomiting.

## 2017-10-08 NOTE — Discharge Instructions (Signed)
Please call and follow up closely with Dr. Terrence Dupont for further management of your chest discomfort.  Please return promptly if your chest pain return.

## 2017-10-08 NOTE — ED Notes (Signed)
ED Provider- PA student at bedside.
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
## 2018-03-20 IMAGING — CR DG CHEST 2V
2 series · 2 of 2 positions shown · non-contrast
Comparison: 12/24/2015

CLINICAL DATA: Chest pain, dizziness starting this morning

EXAM:
CHEST  2 VIEW

[chest pa]
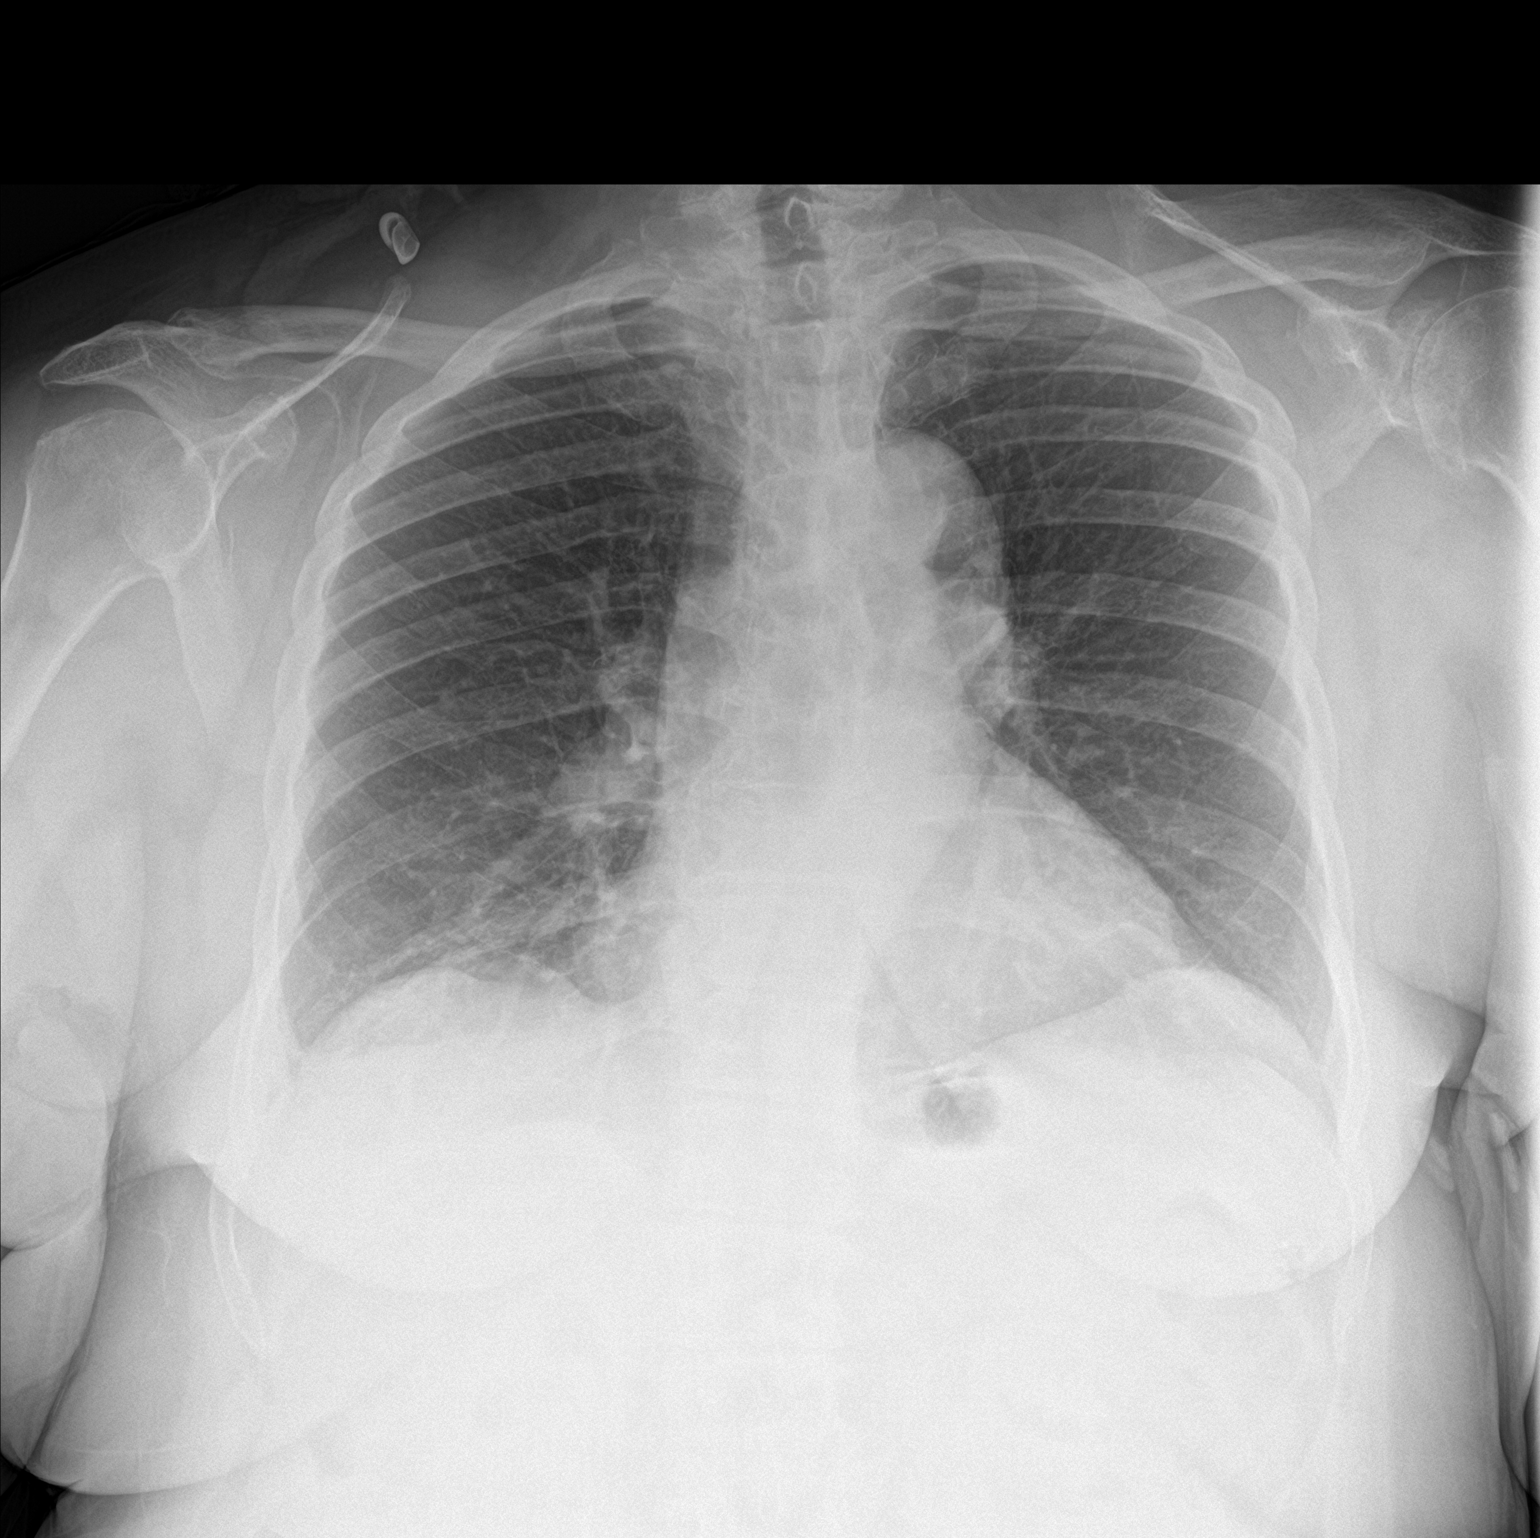

[chest lat]
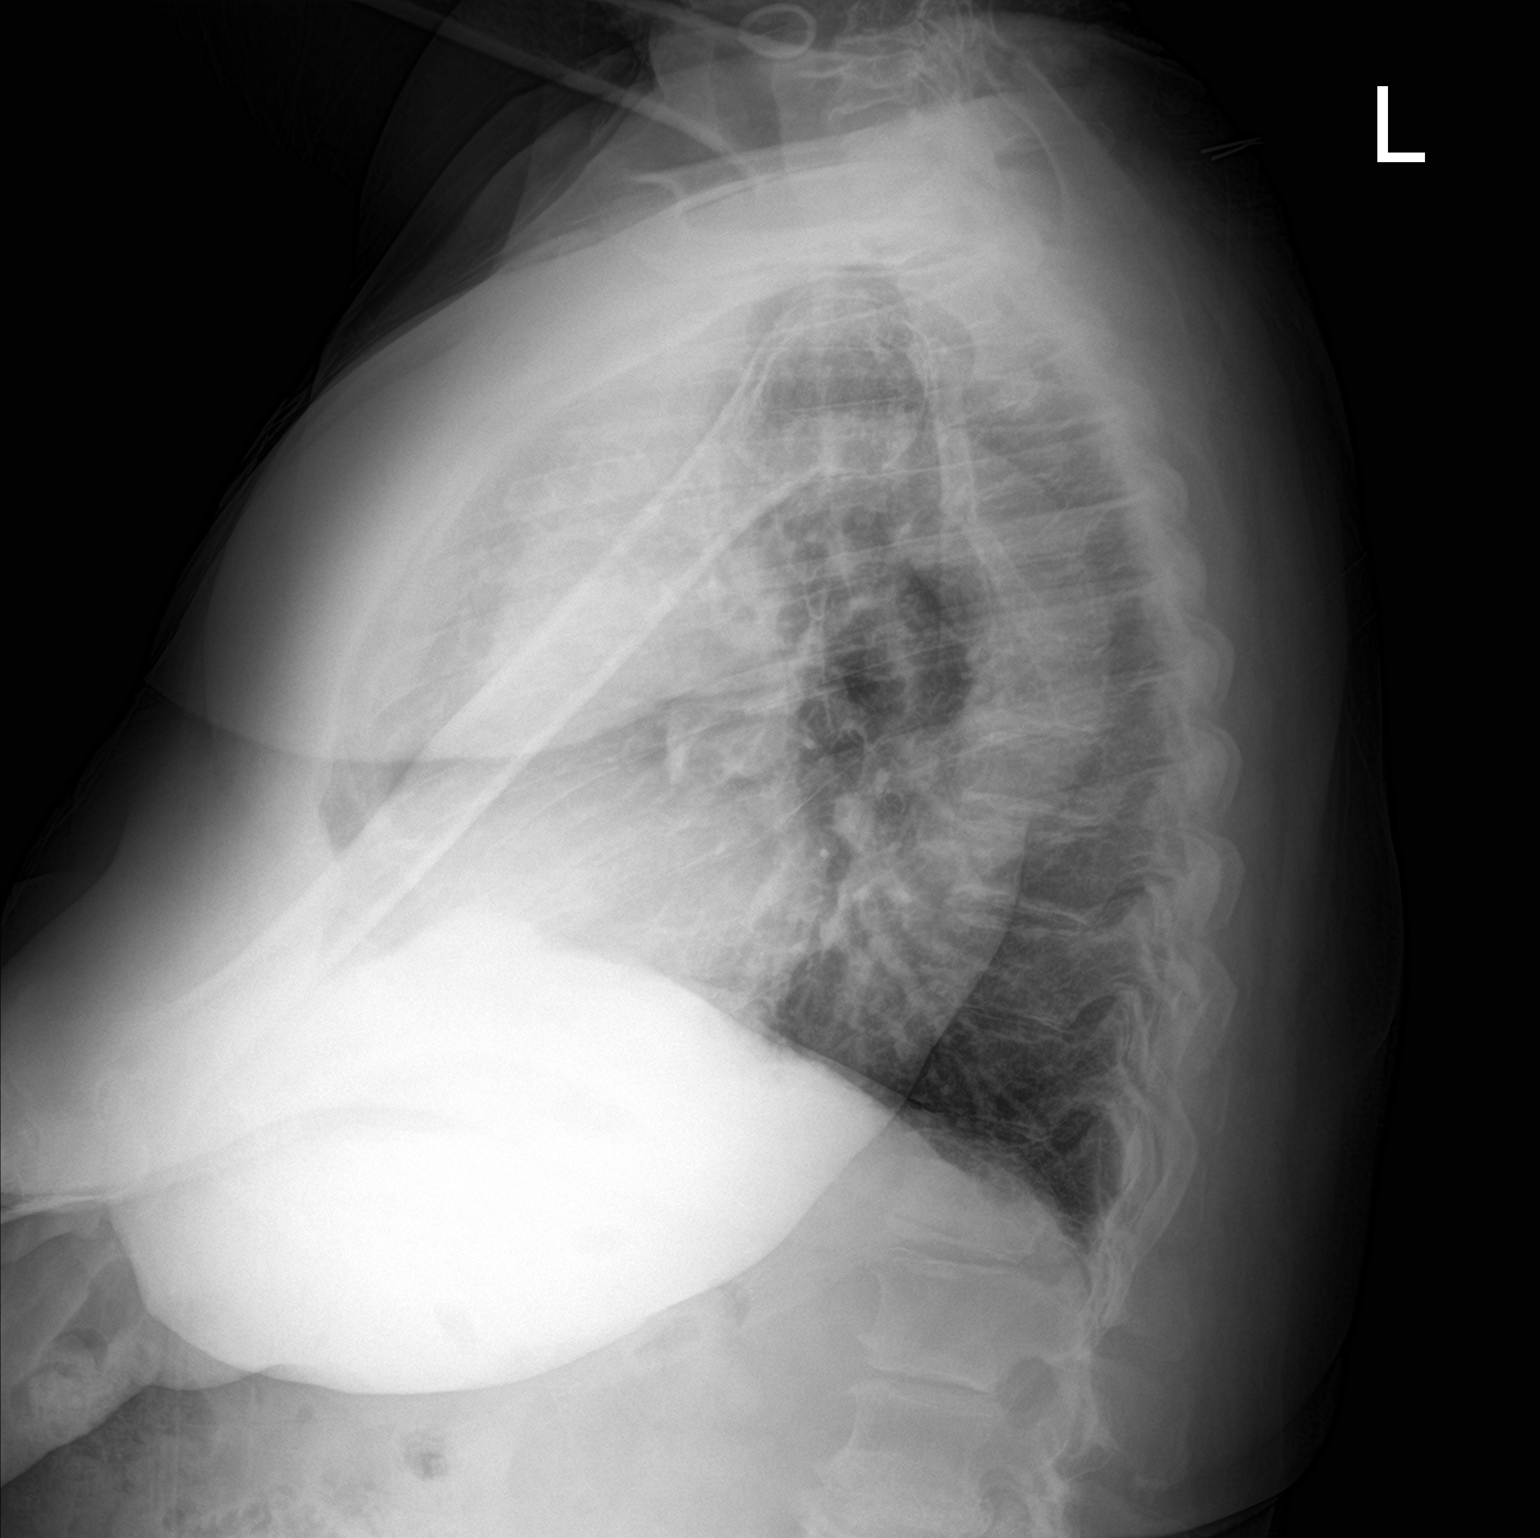

[2 of 2 positions shown; findings below may reference images not displayed]

FINDINGS: Cardiomediastinal silhouette is stable. No infiltrate or pleural
effusion. No pulmonary edema. There is right base medially
atelectasis or scarring. Degenerative changes left shoulder. Mild
degenerative changes thoracic spine.
IMPRESSION: No infiltrate or pulmonary edema. Right base medially atelectasis or
scarring.

## 2018-06-18 ENCOUNTER — Other Ambulatory Visit: Payer: Self-pay | Admitting: Cardiology

## 2018-06-18 DIAGNOSIS — Z1231 Encounter for screening mammogram for malignant neoplasm of breast: Secondary | ICD-10-CM

## 2018-08-03 ENCOUNTER — Ambulatory Visit
Admission: RE | Admit: 2018-08-03 | Discharge: 2018-08-03 | Disposition: A | Discharge status: Home / Self Care | Payer: Medicare Other | Source: Ambulatory Visit | Attending: Cardiology | Admitting: Cardiology

## 2018-08-03 DIAGNOSIS — Z1231 Encounter for screening mammogram for malignant neoplasm of breast: Secondary | ICD-10-CM

## 2019-07-15 ENCOUNTER — Other Ambulatory Visit: Payer: Self-pay | Admitting: Cardiology

## 2019-07-15 DIAGNOSIS — Z1231 Encounter for screening mammogram for malignant neoplasm of breast: Secondary | ICD-10-CM

## 2019-09-15 ENCOUNTER — Other Ambulatory Visit: Payer: Self-pay

## 2019-09-15 ENCOUNTER — Ambulatory Visit
Admission: RE | Admit: 2019-09-15 | Discharge: 2019-09-15 | Disposition: A | Discharge status: Home / Self Care | Payer: Medicare Other | Source: Ambulatory Visit | Attending: Cardiology | Admitting: Cardiology

## 2019-09-15 DIAGNOSIS — Z1231 Encounter for screening mammogram for malignant neoplasm of breast: Secondary | ICD-10-CM

## 2019-12-06 ENCOUNTER — Encounter (HOSPITAL_COMMUNITY): Payer: Self-pay | Admitting: *Deleted

## 2019-12-06 ENCOUNTER — Inpatient Hospital Stay (HOSPITAL_COMMUNITY)
Admission: EM | Admit: 2019-12-06 | Discharge: 2019-12-11 | DRG: 177 | Disposition: A | Discharge status: Home Health | Payer: Medicare Other | Attending: Internal Medicine | Admitting: Internal Medicine

## 2019-12-06 ENCOUNTER — Emergency Department (HOSPITAL_COMMUNITY): Payer: Medicare Other

## 2019-12-06 ENCOUNTER — Other Ambulatory Visit: Payer: Self-pay

## 2019-12-06 DIAGNOSIS — Z6841 Body Mass Index (BMI) 40.0 and over, adult: Secondary | ICD-10-CM

## 2019-12-06 DIAGNOSIS — Z79891 Long term (current) use of opiate analgesic: Secondary | ICD-10-CM

## 2019-12-06 DIAGNOSIS — Z79899 Other long term (current) drug therapy: Secondary | ICD-10-CM | POA: Diagnosis not present

## 2019-12-06 DIAGNOSIS — I2510 Atherosclerotic heart disease of native coronary artery without angina pectoris: Secondary | ICD-10-CM | POA: Diagnosis present

## 2019-12-06 DIAGNOSIS — I959 Hypotension, unspecified: Secondary | ICD-10-CM | POA: Diagnosis present

## 2019-12-06 DIAGNOSIS — N179 Acute kidney failure, unspecified: Secondary | ICD-10-CM | POA: Diagnosis present

## 2019-12-06 DIAGNOSIS — Z803 Family history of malignant neoplasm of breast: Secondary | ICD-10-CM

## 2019-12-06 DIAGNOSIS — Z7982 Long term (current) use of aspirin: Secondary | ICD-10-CM | POA: Diagnosis not present

## 2019-12-06 DIAGNOSIS — E860 Dehydration: Secondary | ICD-10-CM | POA: Diagnosis present

## 2019-12-06 DIAGNOSIS — J9601 Acute respiratory failure with hypoxia: Secondary | ICD-10-CM | POA: Diagnosis present

## 2019-12-06 DIAGNOSIS — N390 Urinary tract infection, site not specified: Secondary | ICD-10-CM | POA: Diagnosis not present

## 2019-12-06 DIAGNOSIS — E785 Hyperlipidemia, unspecified: Secondary | ICD-10-CM | POA: Diagnosis present

## 2019-12-06 DIAGNOSIS — R0902 Hypoxemia: Secondary | ICD-10-CM | POA: Diagnosis present

## 2019-12-06 DIAGNOSIS — R269 Unspecified abnormalities of gait and mobility: Secondary | ICD-10-CM | POA: Diagnosis present

## 2019-12-06 DIAGNOSIS — J1282 Pneumonia due to coronavirus disease 2019: Secondary | ICD-10-CM | POA: Diagnosis present

## 2019-12-06 DIAGNOSIS — R439 Unspecified disturbances of smell and taste: Secondary | ICD-10-CM | POA: Diagnosis present

## 2019-12-06 DIAGNOSIS — R5383 Other fatigue: Secondary | ICD-10-CM | POA: Diagnosis present

## 2019-12-06 DIAGNOSIS — R7401 Elevation of levels of liver transaminase levels: Secondary | ICD-10-CM | POA: Diagnosis present

## 2019-12-06 DIAGNOSIS — I10 Essential (primary) hypertension: Secondary | ICD-10-CM | POA: Diagnosis present

## 2019-12-06 DIAGNOSIS — R7989 Other specified abnormal findings of blood chemistry: Secondary | ICD-10-CM | POA: Diagnosis not present

## 2019-12-06 DIAGNOSIS — R197 Diarrhea, unspecified: Secondary | ICD-10-CM | POA: Diagnosis present

## 2019-12-06 DIAGNOSIS — R0602 Shortness of breath: Secondary | ICD-10-CM

## 2019-12-06 DIAGNOSIS — U071 COVID-19: Principal | ICD-10-CM | POA: Diagnosis present

## 2019-12-06 HISTORY — DX: Gout, unspecified: M10.9

## 2019-12-06 LAB — CBC WITH DIFFERENTIAL/PLATELET
Abs Immature Granulocytes: 0.01 10*3/uL (ref 0.00–0.07)
Basophils Absolute: 0 10*3/uL (ref 0.0–0.1)
Basophils Relative: 1 %
Eosinophils Absolute: 0 10*3/uL (ref 0.0–0.5)
Eosinophils Relative: 0 %
HCT: 43.5 % (ref 36.0–46.0)
Hemoglobin: 14.2 g/dL (ref 12.0–15.0)
Immature Granulocytes: 0 %
Lymphocytes Relative: 40 %
Lymphs Abs: 1.7 10*3/uL (ref 0.7–4.0)
MCH: 29.6 pg (ref 26.0–34.0)
MCHC: 32.6 g/dL (ref 30.0–36.0)
MCV: 90.6 fL (ref 80.0–100.0)
Monocytes Absolute: 0.5 10*3/uL (ref 0.1–1.0)
Monocytes Relative: 12 %
Neutro Abs: 2 10*3/uL (ref 1.7–7.7)
Neutrophils Relative %: 47 %
Platelets: 182 10*3/uL (ref 150–400)
RBC: 4.8 MIL/uL (ref 3.87–5.11)
RDW: 16.6 % — ABNORMAL HIGH (ref 11.5–15.5)
WBC: 4.2 10*3/uL (ref 4.0–10.5)
nRBC: 0 % (ref 0.0–0.2)

## 2019-12-06 LAB — COMPREHENSIVE METABOLIC PANEL
ALT: 46 U/L — ABNORMAL HIGH (ref 0–44)
AST: 57 U/L — ABNORMAL HIGH (ref 15–41)
Albumin: 3.4 g/dL — ABNORMAL LOW (ref 3.5–5.0)
Alkaline Phosphatase: 36 U/L — ABNORMAL LOW (ref 38–126)
Anion gap: 12 (ref 5–15)
BUN: 34 mg/dL — ABNORMAL HIGH (ref 8–23)
CO2: 21 mmol/L — ABNORMAL LOW (ref 22–32)
Calcium: 9.1 mg/dL (ref 8.9–10.3)
Chloride: 103 mmol/L (ref 98–111)
Creatinine, Ser: 1.39 mg/dL — ABNORMAL HIGH (ref 0.44–1.00)
GFR calc Af Amer: 41 mL/min — ABNORMAL LOW (ref >60)
GFR calc non Af Amer: 36 mL/min — ABNORMAL LOW (ref >60)
Glucose, Bld: 121 mg/dL — ABNORMAL HIGH (ref 70–99)
Potassium: 4.5 mmol/L (ref 3.5–5.1)
Sodium: 136 mmol/L (ref 135–145)
Total Bilirubin: 0.2 mg/dL — ABNORMAL LOW (ref 0.3–1.2)
Total Protein: 7.3 g/dL (ref 6.5–8.1)

## 2019-12-06 LAB — PROCALCITONIN: Procalcitonin: <0.1 ng/mL

## 2019-12-06 LAB — TROPONIN I (HIGH SENSITIVITY): Troponin I (High Sensitivity): 19 ng/L — ABNORMAL HIGH (ref <18)

## 2019-12-06 LAB — POC SARS CORONAVIRUS 2 AG -  ED: SARS Coronavirus 2 Ag: POSITIVE — AB

## 2019-12-06 LAB — BRAIN NATRIURETIC PEPTIDE: B Natriuretic Peptide: 25.5 pg/mL (ref 0.0–100.0)

## 2019-12-06 LAB — LACTIC ACID, PLASMA
Lactic Acid, Venous: 1.7 mmol/L (ref 0.5–1.9)
Lactic Acid, Venous: 1.9 mmol/L (ref 0.5–1.9)

## 2019-12-06 LAB — D-DIMER, QUANTITATIVE: D-Dimer, Quant: 1.42 ug/mL-FEU — ABNORMAL HIGH (ref 0.00–0.50)

## 2019-12-06 LAB — C-REACTIVE PROTEIN: CRP: 1.1 mg/dL — ABNORMAL HIGH (ref <1.0)

## 2019-12-06 LAB — LACTATE DEHYDROGENASE: LDH: 303 U/L — ABNORMAL HIGH (ref 98–192)

## 2019-12-06 LAB — FIBRINOGEN: Fibrinogen: 505 mg/dL — ABNORMAL HIGH (ref 210–475)

## 2019-12-06 LAB — FERRITIN: Ferritin: 793 ng/mL — ABNORMAL HIGH (ref 11–307)

## 2019-12-06 MED ORDER — SODIUM CHLORIDE 0.9% FLUSH: 3.0000 mL | Freq: Once | INTRAVENOUS | Status: DC

## 2019-12-06 MED ORDER — DIPHENHYDRAMINE HCL 25 MG PO CAPS: 25.0000 mg | ORAL_CAPSULE | Freq: Four times a day (QID) | ORAL | Status: DC | PRN

## 2019-12-06 MED ORDER — ALBUTEROL SULFATE HFA 108 (90 BASE) MCG/ACT IN AERS
2.0000 | INHALATION_SPRAY | Freq: Four times a day (QID) | RESPIRATORY_TRACT | Status: DC
  Administered 2019-12-07 – 2019-12-11 (×18): 2 via RESPIRATORY_TRACT
  Filled 2019-12-06: qty 6.7

## 2019-12-06 MED ORDER — ATORVASTATIN CALCIUM 10 MG PO TABS
20.0000 mg | ORAL_TABLET | Freq: Every day | ORAL | Status: DC
  Administered 2019-12-06 – 2019-12-10 (×5): 20 mg via ORAL
  Filled 2019-12-06 (×5): qty 2

## 2019-12-06 MED ORDER — CYCLOBENZAPRINE HCL 10 MG PO TABS: 10.0000 mg | ORAL_TABLET | Freq: Three times a day (TID) | ORAL | Status: DC | PRN

## 2019-12-06 MED ORDER — TRAMADOL HCL 50 MG PO TABS
50.0000 mg | ORAL_TABLET | Freq: Three times a day (TID) | ORAL | Status: DC | PRN
  Administered 2019-12-07 – 2019-12-10 (×4): 50 mg via ORAL
  Filled 2019-12-06 (×4): qty 1

## 2019-12-06 MED ORDER — SERTRALINE HCL 50 MG PO TABS
50.0000 mg | ORAL_TABLET | Freq: Every day | ORAL | Status: DC
  Administered 2019-12-06 – 2019-12-11 (×6): 50 mg via ORAL
  Filled 2019-12-06 (×7): qty 1

## 2019-12-06 MED ORDER — SODIUM CHLORIDE 0.9 % IV SOLN
100.0000 mg | Freq: Every day | INTRAVENOUS | Status: AC
  Administered 2019-12-07 – 2019-12-10 (×4): 100 mg via INTRAVENOUS
  Filled 2019-12-06 (×4): qty 20

## 2019-12-06 MED ORDER — HEPARIN SODIUM (PORCINE) 5000 UNIT/ML IJ SOLN
5000.0000 [IU] | Freq: Three times a day (TID) | INTRAMUSCULAR | Status: DC
  Administered 2019-12-06 – 2019-12-11 (×14): 5000 [IU] via SUBCUTANEOUS
  Filled 2019-12-06 (×14): qty 1

## 2019-12-06 MED ORDER — SODIUM CHLORIDE 0.9 % IV SOLN
INTRAVENOUS | Status: DC
  Administered 2019-12-06: 19:00:00 1000 mL via INTRAVENOUS

## 2019-12-06 MED ORDER — HYDRALAZINE HCL 25 MG PO TABS
25.0000 mg | ORAL_TABLET | Freq: Four times a day (QID) | ORAL | Status: DC | PRN
  Filled 2019-12-06: qty 1

## 2019-12-06 MED ORDER — DEXAMETHASONE 6 MG PO TABS
6.0000 mg | ORAL_TABLET | ORAL | Status: DC
  Administered 2019-12-06 – 2019-12-09 (×4): 6 mg via ORAL
  Filled 2019-12-06: qty 2
  Filled 2019-12-06 (×3): qty 1

## 2019-12-06 MED ORDER — ALLOPURINOL 300 MG PO TABS
300.0000 mg | ORAL_TABLET | Freq: Every day | ORAL | Status: DC
  Administered 2019-12-06 – 2019-12-11 (×6): 300 mg via ORAL
  Filled 2019-12-06 (×7): qty 1

## 2019-12-06 MED ORDER — ACETAMINOPHEN 325 MG PO TABS
650.0000 mg | ORAL_TABLET | Freq: Four times a day (QID) | ORAL | Status: DC | PRN
  Administered 2019-12-08 – 2019-12-09 (×2): 650 mg via ORAL
  Filled 2019-12-06 (×2): qty 2

## 2019-12-06 MED ORDER — SODIUM CHLORIDE 0.9 % IV SOLN
200.0000 mg | Freq: Once | INTRAVENOUS | Status: AC
  Administered 2019-12-06: 200 mg via INTRAVENOUS
  Filled 2019-12-06: qty 40

## 2019-12-06 MED ORDER — AMLODIPINE BESYLATE 5 MG PO TABS: 5.0000 mg | ORAL_TABLET | Freq: Every day | ORAL | Status: DC

## 2019-12-06 MED ORDER — ASPIRIN 81 MG PO CHEW
81.0000 mg | CHEWABLE_TABLET | Freq: Every day | ORAL | Status: DC
  Administered 2019-12-06 – 2019-12-11 (×6): 81 mg via ORAL
  Filled 2019-12-06 (×6): qty 1

## 2019-12-06 MED ORDER — ASCORBIC ACID 500 MG PO TABS
500.0000 mg | ORAL_TABLET | Freq: Every day | ORAL | Status: DC
  Administered 2019-12-07 – 2019-12-11 (×5): 500 mg via ORAL
  Filled 2019-12-06 (×5): qty 1

## 2019-12-06 MED ORDER — OMEGA-3-ACID ETHYL ESTERS 1 G PO CAPS
1000.0000 mg | ORAL_CAPSULE | Freq: Every day | ORAL | Status: DC
  Administered 2019-12-06 – 2019-12-11 (×6): 1000 mg via ORAL
  Filled 2019-12-06 (×7): qty 1

## 2019-12-06 MED ORDER — GUAIFENESIN 200 MG PO TABS
400.0000 mg | ORAL_TABLET | Freq: Four times a day (QID) | ORAL | Status: DC | PRN
  Administered 2019-12-09 – 2019-12-11 (×6): 400 mg via ORAL
  Filled 2019-12-06 (×10): qty 2

## 2019-12-06 MED ORDER — LORAZEPAM 0.5 MG PO TABS: 0.5000 mg | ORAL_TABLET | Freq: Every evening | ORAL | Status: DC | PRN

## 2019-12-06 MED ORDER — ZINC SULFATE 220 (50 ZN) MG PO CAPS
220.0000 mg | ORAL_CAPSULE | Freq: Every day | ORAL | Status: DC
  Administered 2019-12-06 – 2019-12-11 (×6): 220 mg via ORAL
  Filled 2019-12-06 (×6): qty 1

## 2019-12-06 MED ORDER — VITAMIN B-12 1000 MCG PO TABS
1000.0000 ug | ORAL_TABLET | Freq: Every day | ORAL | Status: DC
  Administered 2019-12-06 – 2019-12-11 (×6): 1000 ug via ORAL
  Filled 2019-12-06 (×6): qty 1

## 2019-12-06 NOTE — Plan of Care (Addendum)
Plan of care initiated Xray tech called unit and stated that stat CT and Renal Ultrasound was not done in ED and she will be on unit about 0130 to complete tests ordered. Patient made aware of it.

## 2019-12-06 NOTE — ED Notes (Signed)
COVID +  Shaune Pollack daughter [phone] looking for an update on pt

## 2019-12-06 NOTE — ED Triage Notes (Signed)
Pt arrived by [facility] from home. Having dry cough x 3-4 days with fever and diarrhea.  On ems arrival, spo2 was 81% on room air.

## 2019-12-06 NOTE — Progress Notes (Signed)
Patient not in room at this time.
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
## 2019-12-07 ENCOUNTER — Inpatient Hospital Stay (HOSPITAL_COMMUNITY): Payer: Medicare Other

## 2019-12-07 DIAGNOSIS — R7989 Other specified abnormal findings of blood chemistry: Secondary | ICD-10-CM

## 2019-12-07 DIAGNOSIS — U071 COVID-19: Principal | ICD-10-CM

## 2019-12-07 DIAGNOSIS — N179 Acute kidney failure, unspecified: Secondary | ICD-10-CM

## 2019-12-07 LAB — CBC WITH DIFFERENTIAL/PLATELET
Abs Immature Granulocytes: 0.01 10*3/uL (ref 0.00–0.07)
Basophils Absolute: 0 10*3/uL (ref 0.0–0.1)
Basophils Relative: 0 %
Eosinophils Absolute: 0 10*3/uL (ref 0.0–0.5)
Eosinophils Relative: 0 %
HCT: 41.3 % (ref 36.0–46.0)
Hemoglobin: 13.7 g/dL (ref 12.0–15.0)
Immature Granulocytes: 0 %
Lymphocytes Relative: 44 %
Lymphs Abs: 1.5 10*3/uL (ref 0.7–4.0)
MCH: 29.5 pg (ref 26.0–34.0)
MCHC: 33.2 g/dL (ref 30.0–36.0)
MCV: 89 fL (ref 80.0–100.0)
Monocytes Absolute: 0.2 10*3/uL (ref 0.1–1.0)
Monocytes Relative: 5 %
Neutro Abs: 1.7 10*3/uL (ref 1.7–7.7)
Neutrophils Relative %: 51 %
Platelets: 172 10*3/uL (ref 150–400)
RBC: 4.64 MIL/uL (ref 3.87–5.11)
RDW: 16.3 % — ABNORMAL HIGH (ref 11.5–15.5)
WBC: 3.5 10*3/uL — ABNORMAL LOW (ref 4.0–10.5)
nRBC: 0 % (ref 0.0–0.2)

## 2019-12-07 LAB — COMPREHENSIVE METABOLIC PANEL
ALT: 66 U/L — ABNORMAL HIGH (ref 0–44)
AST: 77 U/L — ABNORMAL HIGH (ref 15–41)
Albumin: 3.3 g/dL — ABNORMAL LOW (ref 3.5–5.0)
Alkaline Phosphatase: 41 U/L (ref 38–126)
Anion gap: 13 (ref 5–15)
BUN: 37 mg/dL — ABNORMAL HIGH (ref 8–23)
CO2: 21 mmol/L — ABNORMAL LOW (ref 22–32)
Calcium: 9.1 mg/dL (ref 8.9–10.3)
Chloride: 104 mmol/L (ref 98–111)
Creatinine, Ser: 1.37 mg/dL — ABNORMAL HIGH (ref 0.44–1.00)
GFR calc Af Amer: 42 mL/min — ABNORMAL LOW (ref >60)
GFR calc non Af Amer: 36 mL/min — ABNORMAL LOW (ref >60)
Glucose, Bld: 141 mg/dL — ABNORMAL HIGH (ref 70–99)
Potassium: 4 mmol/L (ref 3.5–5.1)
Sodium: 138 mmol/L (ref 135–145)
Total Bilirubin: 0.6 mg/dL (ref 0.3–1.2)
Total Protein: 7.2 g/dL (ref 6.5–8.1)

## 2019-12-07 LAB — C-REACTIVE PROTEIN: CRP: 1.2 mg/dL — ABNORMAL HIGH (ref <1.0)

## 2019-12-07 LAB — MAGNESIUM: Magnesium: 2.1 mg/dL (ref 1.7–2.4)

## 2019-12-07 LAB — D-DIMER, QUANTITATIVE: D-Dimer, Quant: 1.35 ug/mL-FEU — ABNORMAL HIGH (ref 0.00–0.50)

## 2019-12-07 LAB — PHOSPHORUS: Phosphorus: 2.7 mg/dL (ref 2.5–4.6)

## 2019-12-07 LAB — FERRITIN: Ferritin: 906 ng/mL — ABNORMAL HIGH (ref 11–307)

## 2019-12-07 MED ORDER — ALUM & MAG HYDROXIDE-SIMETH 200-200-20 MG/5ML PO SUSP
30.0000 mL | ORAL | Status: DC | PRN
  Administered 2019-12-07: 30 mL via ORAL
  Filled 2019-12-07 (×2): qty 30

## 2019-12-07 MED ORDER — CALCIUM CARBONATE ANTACID 500 MG PO CHEW
2.0000 | CHEWABLE_TABLET | Freq: Two times a day (BID) | ORAL | Status: DC
  Administered 2019-12-07 – 2019-12-10 (×8): 400 mg via ORAL
  Filled 2019-12-07 (×9): qty 2

## 2019-12-07 NOTE — Plan of Care (Signed)
  Problem: Education: Goal: Knowledge of risk factors and measures for prevention of condition will improve Outcome: Progressing   Problem: Coping: Goal: Psychosocial and spiritual needs will be supported Outcome: Progressing   Problem: Respiratory: Goal: Will maintain a patent airway Outcome: Progressing Goal: Complications related to the disease process, condition or treatment will be avoided or minimized Outcome: Progressing   

## 2019-12-07 NOTE — Evaluation (Signed)
Physical Therapy Evaluation Patient Details Name: Shelby Meza MRN: [id number] DOB: 1941-01-17 Today's Date: 12/07/2019   History of Present Illness  79 year old female admitted 12/06/19 with short of breath, dry cough, feeling weakness, diarrhea, loss of taste and fever for 4 days. Patient with acute hypoxic respiratory failure secondary to COVID PNA. PMH: CAD, HTN, HLD    Clinical Impression  Patient presents with decreased overall strength, impaired balance, and decreased activity tolerance. She desats to 86% just marching in place with RW on 3L suppl oxygen. Patient requires hands on assist for safe mobility at this time. She is below her PLOF of modI with SPC in the home. Recommend continued skilled PT services and SNF for short term rehabilitation. Patient's son does live with patient but she reports he is on disability due shoulder issues so question if he would be able to provide the level of assistance that patient currently requires.    Follow Up Recommendations SNF    Equipment Recommendations  Other (comment)(TBD, patient owns [phone])       Precautions / Restrictions Precautions Precautions: Fall Restrictions Weight Bearing Restrictions: No      Mobility  Bed Mobility Overal bed mobility: Needs Assistance Bed Mobility: Supine to Sit;Sit to Supine     Supine to sit: Supervision(use of bedrail, HOB approx 10 degrees) Sit to supine: Min assist   General bed mobility comments: Minimal assist for LEs into bed  Transfers Overall transfer level: Needs assistance Equipment used: None;Rolling walker (2 wheeled) Transfers: Sit to/from Stand Sit to Stand: Mod assist         General transfer comment: sit<>stand from EOB without AD, sit<>stand with RW and min/modA  Ambulation/Gait             General Gait Details: Marching in place approx 7x each LE with desat to 87% on 3L. Patient rated SOB 6-7/10 with this activity. Side stepping to right toward HOB with RW and  minA      Balance Overall balance assessment: Needs assistance Sitting-balance support: Feet supported;Bilateral upper extremity supported;Single extremity supported Sitting balance-Leahy Scale: Good Sitting balance - Comments: Patient able to reach down to adjust socks with unilat UE support on bed   Standing balance support: No upper extremity supported;Bilateral upper extremity supported Standing balance-Leahy Scale: Poor         Pertinent Vitals/Pain Pain Assessment: No/denies pain    Home Living Family/patient expects to be discharged to:: Private residence Living Arrangements: Children((son lives with patient, home as he is disabled from work)) Available Help at Discharge: Family((dtr and son)) Type of Home: Apartment Home Access: Level entry     Home Layout: One level Home Equipment: Environmental consultant - 4 wheels;Cane - single point;Grab bars - tub/shower;Bedside commode;Wheelchair - manual Additional Comments: Son is disabled from work due to his should. He does the cooking and the cleaning. Son or dtr does grocery shopping. Patient does not drive. She is modI for her mobility and personal care at baseline.     Prior Function Level of Independence: Independent with assistive device(s)    Comments: modI with SPC in the home 4WW outside the home, no falls this year        Extremity/Trunk Assessment        Lower Extremity Assessment Lower Extremity Assessment: LLE deficits/detail;RLE deficits/detail;Generalized weakness RLE Deficits / Details: R knee extension 4/5, R hip flexion 3/5 LLE Deficits / Details: L knee extension 4/5, L hip flexion 3-/5       Communication  Communication: No difficulties  Cognition Arousal/Alertness: Awake/alert Behavior During Therapy: WFL for tasks assessed/performed Overall Cognitive Status: Within Functional Limits for tasks assessed       General Comments General comments (skin integrity, edema, etc.): on 3L  during session. at  rest 91-93%, down to 86% marching in place with HR 100 bpm, 87% sitting EOB    Exercises Other Exercises Other Exercises: Patient using IS upon PT entrance.   Assessment/Plan    PT Assessment Patient needs continued PT services  PT Problem List Decreased strength;Decreased activity tolerance;Decreased balance;Decreased mobility;Cardiopulmonary status limiting activity       PT Treatment Interventions DME instruction;Gait training;Functional mobility training;Therapeutic activities;Therapeutic exercise;Balance training;Patient/family education    PT Goals (Current goals can be found in the Care Plan section)  Acute Rehab PT Goals PT Goal Formulation: With patient Time For Goal Achievement: 12/20/19 Potential to Achieve Goals: Good    Frequency Min 3X/week   Barriers to discharge Decreased caregiver support         AM-PAC PT "6 Clicks" Mobility  Outcome Measure Help needed turning from your back to your side while in a flat bed without using bedrails?: None Help needed moving from lying on your back to sitting on the side of a flat bed without using bedrails?: A Little Help needed moving to and from a bed to a chair (including a wheelchair)?: A Lot Help needed standing up from a chair using your arms (e.g., wheelchair or bedside chair)?: A Lot Help needed to walk in hospital room?: A Lot Help needed climbing 3-5 steps with a railing? : Total 6 Click Score: 14    End of Session Equipment Utilized During Treatment: Gait belt;Oxygen Activity Tolerance: Patient limited by fatigue Patient left: in bed;with call bell/phone within reach Nurse Communication: Mobility status;Other (comment)(O2 saturation response) PT Visit Diagnosis: Unsteadiness on feet (R26.81);Other abnormalities of gait and mobility (R26.89)    Time: 8588-5027 PT Time Calculation (min) (ACUTE ONLY): 25 min   Charges:   PT Evaluation $PT Eval Moderate Complexity: 1 Mod         Birdie Hopes, PT,  DPT Acute Rehab [phone] office    Birdie Hopes 12/07/2019, 4:12 PM

## 2019-12-07 NOTE — Plan of Care (Signed)
Problem: Education: Goal: Knowledge of risk factors and measures for prevention of condition will improve 12/07/2019 1927 by Janne Lab, RN Outcome: Progressing 12/07/2019 1926 by Janne Lab, RN Outcome: Progressing   Problem: Coping: Goal: Psychosocial and spiritual needs will be supported 12/07/2019 1927 by Janne Lab, RN Outcome: Progressing 12/07/2019 1926 by Janne Lab, RN Outcome: Progressing   Problem: Respiratory: Goal: Will maintain a patent airway 12/07/2019 1927 by Janne Lab, RN Outcome: Progressing 12/07/2019 1926 by Janne Lab, RN Outcome: Progressing Goal: Complications related to the disease process, condition or treatment will be avoided or minimized 12/07/2019 1927 by Janne Lab, RN Outcome: Progressing 12/07/2019 1926 by Janne Lab, RN Outcome: Progressing   Problem: Education: Goal: Knowledge of General Education information will improve Description: Including pain rating scale, medication(s)/side effects and non-pharmacologic comfort measures Outcome: Progressing   Problem: Health Behavior/Discharge Planning: Goal: Ability to manage health-related needs will improve Outcome: Progressing   Problem: Clinical Measurements: Goal: Ability to maintain clinical measurements within normal limits will improve Outcome: Progressing Goal: Will remain free from infection Outcome: Progressing Goal: Diagnostic test results will improve Outcome: Progressing Goal: Respiratory complications will improve Outcome: Progressing Goal: Cardiovascular complication will be avoided Outcome: Progressing   Problem: Activity: Goal: Risk for activity intolerance will decrease Outcome: Progressing   Problem: Nutrition: Goal: Adequate nutrition will be maintained Outcome: Progressing   Problem: Coping: Goal: Level of anxiety will decrease Outcome: Progressing   Problem: Elimination: Goal: Will not experience complications  related to bowel motility Outcome: Progressing Goal: Will not experience complications related to urinary retention Outcome: Progressing   Problem: Pain Managment: Goal: General experience of comfort will improve Outcome: Progressing   Problem: Safety: Goal: Ability to remain free from injury will improve Outcome: Progressing   Problem: Skin Integrity: Goal: Risk for impaired skin integrity will decrease Outcome: Progressing   Problem: Education: Goal: Knowledge of risk factors and measures for prevention of condition will improve 12/07/2019 1928 by Janne Lab, RN Outcome: Progressing 12/07/2019 1927 by Janne Lab, RN Outcome: Progressing 12/07/2019 1926 by Janne Lab, RN Outcome: Progressing   Problem: Coping: Goal: Psychosocial and spiritual needs will be supported 12/07/2019 1928 by Janne Lab, RN Outcome: Progressing 12/07/2019 1927 by Janne Lab, RN Outcome: Progressing 12/07/2019 1926 by Janne Lab, RN Outcome: Progressing   Problem: Respiratory: Goal: Will maintain a patent airway 12/07/2019 1928 by Janne Lab, RN Outcome: Progressing 12/07/2019 1927 by Janne Lab, RN Outcome: Progressing 12/07/2019 1926 by Janne Lab, RN Outcome: Progressing Goal: Complications related to the disease process, condition or treatment will be avoided or minimized 12/07/2019 1928 by Janne Lab, RN Outcome: Progressing 12/07/2019 1927 by Janne Lab, RN Outcome: Progressing 12/07/2019 1926 by Janne Lab, RN Outcome: Progressing   Problem: Education: Goal: Knowledge of General Education information will improve Description: Including pain rating scale, medication(s)/side effects and non-pharmacologic comfort measures 12/07/2019 1928 by Janne Lab, RN Outcome: Progressing 12/07/2019 1927 by Janne Lab, RN Outcome: Progressing   Problem: Health Behavior/Discharge Planning: Goal: Ability to manage  health-related needs will improve 12/07/2019 1928 by Janne Lab, RN Outcome: Progressing 12/07/2019 1927 by Janne Lab, RN Outcome: Progressing   Problem: Clinical Measurements: Goal: Ability to maintain clinical measurements within normal limits will improve 12/07/2019 1928 by Janne Lab, RN Outcome: Progressing 12/07/2019 1927 by Janne Lab, RN Outcome: Progressing Goal: Will remain free from infection 12/07/2019 1928  by Janne Lab, RN Outcome: Progressing 12/07/2019 1927 by Janne Lab, RN Outcome: Progressing Goal: Diagnostic test results will improve 12/07/2019 1928 by Janne Lab, RN Outcome: Progressing 12/07/2019 1927 by Janne Lab, RN Outcome: Progressing Goal: Respiratory complications will improve 12/07/2019 1928 by Janne Lab, RN Outcome: Progressing 12/07/2019 1927 by Janne Lab, RN Outcome: Progressing Goal: Cardiovascular complication will be avoided 12/07/2019 1928 by Janne Lab, RN Outcome: Progressing 12/07/2019 1927 by Janne Lab, RN Outcome: Progressing   Problem: Activity: Goal: Risk for activity intolerance will decrease 12/07/2019 1928 by Janne Lab, RN Outcome: Progressing 12/07/2019 1927 by Janne Lab, RN Outcome: Progressing   Problem: Nutrition: Goal: Adequate nutrition will be maintained 12/07/2019 1928 by Janne Lab, RN Outcome: Progressing 12/07/2019 1927 by Janne Lab, RN Outcome: Progressing   Problem: Coping: Goal: Level of anxiety will decrease 12/07/2019 1928 by Janne Lab, RN Outcome: Progressing 12/07/2019 1927 by Janne Lab, RN Outcome: Progressing   Problem: Elimination: Goal: Will not experience complications related to bowel motility 12/07/2019 1928 by Janne Lab, RN Outcome: Progressing 12/07/2019 1927 by Janne Lab, RN Outcome: Progressing Goal: Will not experience complications related to urinary retention 12/07/2019  1928 by Janne Lab, RN Outcome: Progressing 12/07/2019 1927 by Janne Lab, RN Outcome: Progressing   Problem: Pain Managment: Goal: General experience of comfort will improve 12/07/2019 1928 by Janne Lab, RN Outcome: Progressing 12/07/2019 1927 by Janne Lab, RN Outcome: Progressing   Problem: Safety: Goal: Ability to remain free from injury will improve 12/07/2019 1928 by Janne Lab, RN Outcome: Progressing 12/07/2019 1927 by Janne Lab, RN Outcome: Progressing   Problem: Skin Integrity: Goal: Risk for impaired skin integrity will decrease 12/07/2019 1928 by Janne Lab, RN Outcome: Progressing 12/07/2019 1927 by Janne Lab, RN Outcome: Progressing
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
## 2019-12-08 DIAGNOSIS — J9601 Acute respiratory failure with hypoxia: Secondary | ICD-10-CM

## 2019-12-08 LAB — CBC WITH DIFFERENTIAL/PLATELET
Abs Immature Granulocytes: 0.01 10*3/uL (ref 0.00–0.07)
Basophils Absolute: 0 10*3/uL (ref 0.0–0.1)
Basophils Relative: 0 %
Eosinophils Absolute: 0 10*3/uL (ref 0.0–0.5)
Eosinophils Relative: 0 %
HCT: 41.1 % (ref 36.0–46.0)
Hemoglobin: 13.7 g/dL (ref 12.0–15.0)
Immature Granulocytes: 0 %
Lymphocytes Relative: 35 %
Lymphs Abs: 1.7 10*3/uL (ref 0.7–4.0)
MCH: 29.7 pg (ref 26.0–34.0)
MCHC: 33.3 g/dL (ref 30.0–36.0)
MCV: 89 fL (ref 80.0–100.0)
Monocytes Absolute: 0.5 10*3/uL (ref 0.1–1.0)
Monocytes Relative: 11 %
Neutro Abs: 2.5 10*3/uL (ref 1.7–7.7)
Neutrophils Relative %: 54 %
Platelets: 202 10*3/uL (ref 150–400)
RBC: 4.62 MIL/uL (ref 3.87–5.11)
RDW: 16.3 % — ABNORMAL HIGH (ref 11.5–15.5)
WBC: 4.7 10*3/uL (ref 4.0–10.5)
nRBC: 0 % (ref 0.0–0.2)

## 2019-12-08 LAB — FERRITIN: Ferritin: 938 ng/mL — ABNORMAL HIGH (ref 11–307)

## 2019-12-08 LAB — COMPREHENSIVE METABOLIC PANEL
ALT: 71 U/L — ABNORMAL HIGH (ref 0–44)
AST: 66 U/L — ABNORMAL HIGH (ref 15–41)
Albumin: 3.1 g/dL — ABNORMAL LOW (ref 3.5–5.0)
Alkaline Phosphatase: 37 U/L — ABNORMAL LOW (ref 38–126)
Anion gap: 10 (ref 5–15)
BUN: 38 mg/dL — ABNORMAL HIGH (ref 8–23)
CO2: 24 mmol/L (ref 22–32)
Calcium: 9.4 mg/dL (ref 8.9–10.3)
Chloride: 104 mmol/L (ref 98–111)
Creatinine, Ser: 1.14 mg/dL — ABNORMAL HIGH (ref 0.44–1.00)
GFR calc Af Amer: 53 mL/min — ABNORMAL LOW (ref >60)
GFR calc non Af Amer: 45 mL/min — ABNORMAL LOW (ref >60)
Glucose, Bld: 116 mg/dL — ABNORMAL HIGH (ref 70–99)
Potassium: 5 mmol/L (ref 3.5–5.1)
Sodium: 138 mmol/L (ref 135–145)
Total Bilirubin: <0.1 mg/dL — ABNORMAL LOW (ref 0.3–1.2)
Total Protein: 6.9 g/dL (ref 6.5–8.1)

## 2019-12-08 LAB — C-REACTIVE PROTEIN: CRP: 0.8 mg/dL (ref <1.0)

## 2019-12-08 LAB — PHOSPHORUS: Phosphorus: 2.1 mg/dL — ABNORMAL LOW (ref 2.5–4.6)

## 2019-12-08 LAB — D-DIMER, QUANTITATIVE: D-Dimer, Quant: 0.97 ug/mL-FEU — ABNORMAL HIGH (ref 0.00–0.50)

## 2019-12-08 LAB — MAGNESIUM: Magnesium: 2 mg/dL (ref 1.7–2.4)

## 2019-12-08 NOTE — Plan of Care (Signed)

## 2019-12-08 NOTE — TOC Initial Note (Signed)
Transition of Care [facility]) - Initial/Assessment Note    Patient Details  Name: Shelby Meza MRN: [id number] Date of Birth: Sep 15, 1940  [facility] CM/SW Contact:    Ralph Dowdy, [place] Work Phone Number: 12/08/2019, 3:03 PM  Clinical Narrative:                 CSW received consult for possible SNF placement at time of discharge. CSW spoke with patient regarding PT recommendation of SNF placement at time of discharge. Patient said to contact daughter about SNF decision.   CSW contacted Shaune Pollack [phone]). CSW discussed PT's recommendation of SNF placement. Shelby Meza said that their family would like to pursue home health at this time. CSW confirmed address and PCP. Patient and daughter does not have preference for home health agency at this time. CSW asked about equipment needs and Shelby Meza said that pt would need a rolling walker with a seat. Pt has many canes at home. Shelby Meza stated that her family would provide patient with 24/7 care at home. Case manager and toc team will continue with discharge needs as needed.  Expected Discharge Plan: [place] Barriers to Discharge: Continued Medical Work up   Patient Goals and CMS Choice Patient states their goals for this hospitalization and ongoing recovery are:: To get well enough to go home. CMS Medicare.gov Compare Post Acute Care list provided to:: Other (Comment Required)(Shelby Meza, Daughter) Choice offered to / list presented to : Adult Children  Expected Discharge Plan and Services Expected Discharge Plan: [place] In-house Referral: Clinical Social Work Discharge Planning Services: CM Consult Post Acute Care Choice: [place] arrangements for the past 2 months: Single Family Home                           HH Arranged: PT, OT, Refused SNF          Prior Living Arrangements/Services Living arrangements for the past 2 months: Single Family  Home Lives with:: Adult Children, Self Patient language and need for interpreter reviewed:: Yes Do you feel safe going back to the place where you live?: Yes      Need for Family Participation in Patient Care: Yes (Comment) Care giver support system in place?: Yes (comment)   Criminal Activity/Legal Involvement Pertinent to Current Situation/Hospitalization: No - Comment as needed  Activities of Daily Living Home Assistive Devices/Equipment: Cane (specify quad or straight), Walker (specify type) ADL Screening (condition at time of admission) Patient's cognitive ability adequate to safely complete daily activities?: Yes Is the patient deaf or have difficulty hearing?: No Does the patient have difficulty seeing, even when wearing glasses/contacts?: No Does the patient have difficulty concentrating, remembering, or making decisions?: No Patient able to express need for assistance with ADLs?: Yes Does the patient have difficulty dressing or bathing?: No Independently performs ADLs?: Yes (appropriate for developmental age) Does the patient have difficulty walking or climbing stairs?: No Weakness of Legs: Both Weakness of Arms/Hands: None  Permission Sought/Granted Permission sought to share information with : Family Supports, Case Manager Permission granted to share information with : Yes, Verbal Permission Granted  Share Information with NAME: Shaune Pollack           Emotional Assessment Appearance:: Other (Comment Required(unable to assess due to COVID restrictions) Attitude/Demeanor/Rapport: Engaged, Gracious Affect (typically observed): Accepting, Adaptable, Appropriate, Stable, Hopeful Orientation: : Oriented to Self, Oriented to Place, Oriented to  Time, Oriented  to Situation Alcohol / Substance Use: Not Applicable Psych Involvement: No (comment)  Admission diagnosis:  AKI (acute kidney injury) ([place]) [N17.9] LFT elevation [R79.89] COVID-19 [U07.1] Patient Active Problem  List   Diagnosis Date Noted  . COVID-19 virus infection 12/06/2019  . COVID-19 12/06/2019  . LFT elevation   . Acute coronary syndrome ([place]) 12/24/2015  . Chest pain 10/07/2013  . Unstable angina ([place]) 10/07/2013   PCP:  Charolette Forward, MD Pharmacy:   [facility] [address] [place]), [place] - [address] [place] [address] Phone: [phone] Fax: [phone]     Social Determinants of Health (SDOH) Interventions    Readmission Risk Interventions No flowsheet data found.

## 2019-12-08 NOTE — Progress Notes (Signed)
Physical Therapy Treatment Patient Details Name: Shelby Meza MRN: [id number] DOB: 05-Dec-1940 Today's Date: 12/08/2019    History of Present Illness 79 year old female admitted 12/06/19 with short of breath, dry cough, feeling weakness, diarrhea, loss of taste and fever for 4 days. Patient with acute hypoxic respiratory failure secondary to COVID PNA. PMH: CAD, HTN, HLD    PT Comments    Patient with improved mobility and activity tolerance today but continues to require hands on assistance for safe mobility with use of 4WW. Pending length of hospitalization and mobility progress while in the hospital, currently recommend SNF for short term rehabilitation. Patient requires hands on assist for safe mobility with use of 4WW. Patient on 3L [place] at rest, put on 4L during gait trial as she was desatting on 3L. On 4L, oxygen saturation down to 86%, recovered to 92% with seated rest break on rollator seat. Oxygen saturation 85% after 2nd gait trial. Patient left on 4L for continued recovery. Discussed with nurse and nurse to go in and assess patient.     Follow Up Recommendations  SNF     Equipment Recommendations  Other (comment)(patient owns [id number])       Precautions / Restrictions Precautions Precautions: Fall;Other (comment) Precaution Comments: monitor oxygen saturation Restrictions Weight Bearing Restrictions: No    Mobility  Bed Mobility Overal bed mobility: Needs Assistance Bed Mobility: Supine to Sit     Supine to sit: Supervision     General bed mobility comments: use of bedrail  Transfers Overall transfer level: Needs assistance Equipment used: 4-wheeled walker Transfers: Sit to/from Stand Sit to Stand: Min assist;Mod assist         General transfer comment: sit>stand from EOB with 4WW and cues for brake mgmt and hand placement  Ambulation/Gait Ambulation/Gait assistance: Min guard;Min assist Gait Distance (Feet): 30 Feet(40) Assistive device: 4-wheeled  walker Gait Pattern/deviations: Decreased step length - right;Decreased step length - left Gait velocity: decreased   General Gait Details: Patient put on 4L for ambulation as she was desatting on 3L. Seated rest break on rollator seat inbetween gait trials. 2nd person for line mgmt and safety.     Balance Overall balance assessment: Needs assistance Sitting-balance support: Feet supported Sitting balance-Leahy Scale: Good     Standing balance support: Bilateral upper extremity supported Standing balance-Leahy Scale: Poor     Cognition Arousal/Alertness: Awake/alert Behavior During Therapy: WFL for tasks assessed/performed Overall Cognitive Status: Within Functional Limits for tasks assessed        General Comments General comments (skin integrity, edema, etc.): Patient on 3L [place] at rest, put on 4L during gait trial as she was desatting on 3L. On 4L, oxygen saturation down to 86%, recovered to 92% with seated rest break on rollator seat. Oxygen saturation 85% after 2nd gait trial. Patient left on 4L for continued recovery. Discussed with nurse and nurse to go in and assess patient next.      Pertinent Vitals/Pain Pain Assessment: 0-10 Pain Score: 5  Pain Location: headache  Pain Intervention(s): RN gave pain meds during session;Monitored during session;Limited activity within patient's tolerance           PT Goals (current goals can now be found in the care plan section) Progress towards PT goals: Progressing toward goals    Frequency    Min 3X/week      PT Plan Current plan remains appropriate       AM-PAC PT "6 Clicks" Mobility   Outcome Measure  Help needed  turning from your back to your side while in a flat bed without using bedrails?: None Help needed moving from lying on your back to sitting on the side of a flat bed without using bedrails?: A Little Help needed moving to and from a bed to a chair (including a wheelchair)?: A Little Help needed standing  up from a chair using your arms (e.g., wheelchair or bedside chair)?: A Little Help needed to walk in hospital room?: A Little Help needed climbing 3-5 steps with a railing? : Total 6 Click Score: 17    End of Session Equipment Utilized During Treatment: Gait belt;Oxygen Activity Tolerance: Patient tolerated treatment well;Patient limited by fatigue Patient left: in chair;with call bell/phone within reach Nurse Communication: Mobility status;Other (comment)(oxygen saturation response) PT Visit Diagnosis: Unsteadiness on feet (R26.81);Other abnormalities of gait and mobility (R26.89)     Time: 6701-1003 PT Time Calculation (min) (ACUTE ONLY): 34 min  Charges:  $Gait Training: 23-37 mins                     Birdie Hopes, PT, DPT Acute Rehab [phone] office     Birdie Hopes 12/08/2019, 2:34 PM

## 2019-12-08 NOTE — Plan of Care (Signed)
  Problem: Education: Goal: Knowledge of risk factors and measures for prevention of condition will improve Outcome: Progressing   Problem: Education: Goal: Knowledge of risk factors and measures for prevention of condition will improve Outcome: Progressing   Problem: Coping: Goal: Psychosocial and spiritual needs will be supported Outcome: Progressing   Problem: Respiratory: Goal: Will maintain a patent airway Outcome: Progressing Goal: Complications related to the disease process, condition or treatment will be avoided or minimized Outcome: Progressing   Problem: Education: Goal: Knowledge of General Education information will improve Description: Including pain rating scale, medication(s)/side effects and non-pharmacologic comfort measures Outcome: Progressing   Problem: Health Behavior/Discharge Planning: Goal: Ability to manage health-related needs will improve Outcome: Progressing   Problem: Clinical Measurements: Goal: Ability to maintain clinical measurements within normal limits will improve Outcome: Progressing Goal: Will remain free from infection Outcome: Progressing Goal: Diagnostic test results will improve Outcome: Progressing Goal: Respiratory complications will improve Outcome: Progressing Goal: Cardiovascular complication will be avoided Outcome: Progressing   Problem: Activity: Goal: Risk for activity intolerance will decrease Outcome: Progressing   Problem: Nutrition: Goal: Adequate nutrition will be maintained Outcome: Progressing   Problem: Coping: Goal: Level of anxiety will decrease Outcome: Progressing   Problem: Elimination: Goal: Will not experience complications related to bowel motility Outcome: Progressing Goal: Will not experience complications related to urinary retention Outcome: Progressing   Problem: Pain Managment: Goal: General experience of comfort will improve Outcome: Progressing   Problem: Safety: Goal: Ability to  remain free from injury will improve Outcome: Progressing   Problem: Skin Integrity: Goal: Risk for impaired skin integrity will decrease Outcome: Progressing
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
## 2019-12-09 ENCOUNTER — Inpatient Hospital Stay (HOSPITAL_COMMUNITY): Payer: Medicare Other

## 2019-12-09 LAB — COMPREHENSIVE METABOLIC PANEL
ALT: 121 U/L — ABNORMAL HIGH (ref 0–44)
AST: 122 U/L — ABNORMAL HIGH (ref 15–41)
Albumin: 3.2 g/dL — ABNORMAL LOW (ref 3.5–5.0)
Alkaline Phosphatase: 38 U/L (ref 38–126)
Anion gap: 12 (ref 5–15)
BUN: 39 mg/dL — ABNORMAL HIGH (ref 8–23)
CO2: 22 mmol/L (ref 22–32)
Calcium: 9.9 mg/dL (ref 8.9–10.3)
Chloride: 101 mmol/L (ref 98–111)
Creatinine, Ser: 1.14 mg/dL — ABNORMAL HIGH (ref 0.44–1.00)
GFR calc Af Amer: 53 mL/min — ABNORMAL LOW (ref >60)
GFR calc non Af Amer: 45 mL/min — ABNORMAL LOW (ref >60)
Glucose, Bld: 122 mg/dL — ABNORMAL HIGH (ref 70–99)
Potassium: 4.8 mmol/L (ref 3.5–5.1)
Sodium: 135 mmol/L (ref 135–145)
Total Bilirubin: 0.3 mg/dL (ref 0.3–1.2)
Total Protein: 7 g/dL (ref 6.5–8.1)

## 2019-12-09 LAB — CBC WITH DIFFERENTIAL/PLATELET
Abs Immature Granulocytes: 0.02 10*3/uL (ref 0.00–0.07)
Basophils Absolute: 0 10*3/uL (ref 0.0–0.1)
Basophils Relative: 0 %
Eosinophils Absolute: 0 10*3/uL (ref 0.0–0.5)
Eosinophils Relative: 0 %
HCT: 42.8 % (ref 36.0–46.0)
Hemoglobin: 14.2 g/dL (ref 12.0–15.0)
Immature Granulocytes: 0 %
Lymphocytes Relative: 16 %
Lymphs Abs: 1.4 10*3/uL (ref 0.7–4.0)
MCH: 29.7 pg (ref 26.0–34.0)
MCHC: 33.2 g/dL (ref 30.0–36.0)
MCV: 89.5 fL (ref 80.0–100.0)
Monocytes Absolute: 1.1 10*3/uL — ABNORMAL HIGH (ref 0.1–1.0)
Monocytes Relative: 12 %
Neutro Abs: 6.5 10*3/uL (ref 1.7–7.7)
Neutrophils Relative %: 72 %
Platelets: 224 10*3/uL (ref 150–400)
RBC: 4.78 MIL/uL (ref 3.87–5.11)
RDW: 16.3 % — ABNORMAL HIGH (ref 11.5–15.5)
WBC: 9.1 10*3/uL (ref 4.0–10.5)
nRBC: 0 % (ref 0.0–0.2)

## 2019-12-09 LAB — D-DIMER, QUANTITATIVE: D-Dimer, Quant: 0.63 ug/mL-FEU — ABNORMAL HIGH (ref 0.00–0.50)

## 2019-12-09 LAB — BRAIN NATRIURETIC PEPTIDE: B Natriuretic Peptide: 40.2 pg/mL (ref 0.0–100.0)

## 2019-12-09 LAB — C-REACTIVE PROTEIN: CRP: 1.8 mg/dL — ABNORMAL HIGH (ref <1.0)

## 2019-12-09 LAB — PROCALCITONIN: Procalcitonin: <0.1 ng/mL

## 2019-12-09 LAB — FERRITIN: Ferritin: 1070 ng/mL — ABNORMAL HIGH (ref 11–307)

## 2019-12-09 MED ORDER — FUROSEMIDE 10 MG/ML IJ SOLN
40.0000 mg | Freq: Once | INTRAMUSCULAR | Status: AC
  Administered 2019-12-09: 14:00:00 40 mg via INTRAVENOUS
  Filled 2019-12-09: qty 4
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
## 2019-12-09 NOTE — Plan of Care (Signed)

## 2019-12-09 NOTE — Progress Notes (Signed)
Updated daughter Evelena Peat
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
## 2019-12-10 LAB — CBC WITH DIFFERENTIAL/PLATELET
Abs Immature Granulocytes: 0.03 10*3/uL (ref 0.00–0.07)
Basophils Absolute: 0 10*3/uL (ref 0.0–0.1)
Basophils Relative: 0 %
Eosinophils Absolute: 0 10*3/uL (ref 0.0–0.5)
Eosinophils Relative: 0 %
HCT: 39.7 % (ref 36.0–46.0)
Hemoglobin: 13.5 g/dL (ref 12.0–15.0)
Immature Granulocytes: 0 %
Lymphocytes Relative: 23 %
Lymphs Abs: 1.6 10*3/uL (ref 0.7–4.0)
MCH: 29.9 pg (ref 26.0–34.0)
MCHC: 34 g/dL (ref 30.0–36.0)
MCV: 88 fL (ref 80.0–100.0)
Monocytes Absolute: 0.5 10*3/uL (ref 0.1–1.0)
Monocytes Relative: 8 %
Neutro Abs: 4.7 10*3/uL (ref 1.7–7.7)
Neutrophils Relative %: 69 %
Platelets: 232 10*3/uL (ref 150–400)
RBC: 4.51 MIL/uL (ref 3.87–5.11)
RDW: 16.1 % — ABNORMAL HIGH (ref 11.5–15.5)
WBC: 6.9 10*3/uL (ref 4.0–10.5)
nRBC: 0 % (ref 0.0–0.2)

## 2019-12-10 LAB — COMPREHENSIVE METABOLIC PANEL
ALT: 96 U/L — ABNORMAL HIGH (ref 0–44)
AST: 50 U/L — ABNORMAL HIGH (ref 15–41)
Albumin: 2.9 g/dL — ABNORMAL LOW (ref 3.5–5.0)
Alkaline Phosphatase: 35 U/L — ABNORMAL LOW (ref 38–126)
Anion gap: 11 (ref 5–15)
BUN: 50 mg/dL — ABNORMAL HIGH (ref 8–23)
CO2: 24 mmol/L (ref 22–32)
Calcium: 9.9 mg/dL (ref 8.9–10.3)
Chloride: 98 mmol/L (ref 98–111)
Creatinine, Ser: 1.24 mg/dL — ABNORMAL HIGH (ref 0.44–1.00)
GFR calc Af Amer: 48 mL/min — ABNORMAL LOW (ref >60)
GFR calc non Af Amer: 41 mL/min — ABNORMAL LOW (ref >60)
Glucose, Bld: 131 mg/dL — ABNORMAL HIGH (ref 70–99)
Potassium: 4.7 mmol/L (ref 3.5–5.1)
Sodium: 133 mmol/L — ABNORMAL LOW (ref 135–145)
Total Bilirubin: 0.6 mg/dL (ref 0.3–1.2)
Total Protein: 6.5 g/dL (ref 6.5–8.1)

## 2019-12-10 LAB — D-DIMER, QUANTITATIVE: D-Dimer, Quant: 0.46 ug/mL-FEU (ref 0.00–0.50)

## 2019-12-10 LAB — FERRITIN: Ferritin: 961 ng/mL — ABNORMAL HIGH (ref 11–307)

## 2019-12-10 LAB — C-REACTIVE PROTEIN: CRP: 5.6 mg/dL — ABNORMAL HIGH (ref <1.0)

## 2019-12-10 MED ORDER — METHYLPREDNISOLONE SODIUM SUCC 40 MG IJ SOLR
40.0000 mg | Freq: Two times a day (BID) | INTRAMUSCULAR | Status: DC
  Administered 2019-12-10 – 2019-12-11 (×2): 40 mg via INTRAVENOUS
  Filled 2019-12-10 (×2): qty 1

## 2019-12-10 NOTE — Progress Notes (Signed)
SATURATION QUALIFICATIONS: (This note is used to comply with regulatory documentation for home oxygen)  Patient Saturations on Room Air at Rest = 90%  Patient Saturations on Room Air while Ambulating = 80%  Patient Saturations on 2-3 Liters of oxygen while Ambulating = 91%  Please briefly explain why patient needs home oxygen:  Patient de-sats while ambulating around room and transferring from bed to chair. With 2-3L of O2 her saturations are maintained in the low 90's

## 2019-12-10 NOTE — TOC Progression Note (Addendum)
   [facility] Transition of Care [facility]) - Progression Note    Patient Details  Name: Shelby Meza MRN: [id number] Date of Birth: 03/25/1941  [facility] CM/SW Contact  Maryclare Labrador, RN Phone Number: 12/10/2019, 3:57 PM  Clinical Narrative:    Pt will discharge home on oxygen.  CM gave daughter agency choice - [facility] chosen.  Referral accepted.  Pt will discharge to her daughter's [place] home; [place] [address] [address] - both [place] and [facility] provided address.  [facility] will provide PT,PT and RN as ordered   Expected Discharge Plan: [place] Barriers to Discharge: Continued Medical Work up  Expected Discharge Plan and Services Expected Discharge Plan: [place] In-house Referral: Clinical Social Work Discharge Planning Services: CM Consult Post Acute Care Choice: Durable Medical Equipment, Home Health Living arrangements for the past 2 months: Single Family Home                           HH Arranged: PT, OT, Refused SNF [place] Agency: [facility] Date [place]: 12/08/19 Time [place]: [phone] Representative spoke with at [place]: Cassie   Social Determinants of Health ([place]) Interventions    Readmission Risk Interventions No flowsheet data found.

## 2019-12-10 NOTE — Progress Notes (Signed)
Physical Therapy Treatment Patient Details Name: Shelby Meza MRN: [id number] DOB: 1940-10-20 Today's Date: 12/10/2019    History of Present Illness 79 year old female admitted 12/06/19 with short of breath, dry cough, feeling weakness, diarrhea, loss of taste and fever for 4 days. Patient with acute hypoxic respiratory failure secondary to COVID PNA. PMH: CAD, HTN, HLD    PT Comments    Patient continues to require physical assist for transfers. She is limited by decreased activity tolerance and arthritis in knees, especially L knee today. Oxygen saturation 84% on room air after BSC transfer. 3L for ambulation in room, unable to get accurate oxygen saturation during gait trial but DOE noted and patient reporting SOB 7/10 with ambulation. Patient requesting back to bed at end of session. On 1L [place] at rest for continued recovery. O2 sat 90%. Continued recommendation for SNF as patient is below her PLOF and requires physical assist for her mobility.  If patient decides to discharge home, she will need 24/7 assist, home PT, and use of 4WW for all mobility.   Follow Up Recommendations  SNF     Equipment Recommendations  Other (comment)(patient owns [phone])       Precautions / Restrictions Precautions Precautions: Fall;Other (comment) Precaution Comments: monitor oxygen saturation Restrictions Weight Bearing Restrictions: No    Mobility  Bed Mobility Overal bed mobility: Needs Assistance Bed Mobility: Supine to Sit;Sit to Supine     Supine to sit: Supervision Sit to supine: Supervision   General bed mobility comments: supervision for improved positioning. Patient initially trying to lay in bed for recovery in low, diagonal position.  Transfers Overall transfer level: Needs assistance Equipment used: 4-wheeled walker Transfers: Sit to/from [facility] Sit to Stand: Min assist Stand pivot transfers: Min assist       General transfer comment: SPT BSC>EOB with UE  support on armrests of BSC, sit<>stand from EOB x 2 trials with minA. Patient attempted on her own and was unable to.   Ambulation/Gait Ambulation/Gait assistance: Supervision;Min guard Gait Distance (Feet): 30 Feet Assistive device: 4-wheeled walker Gait Pattern/deviations: Decreased step length - right;Decreased step length - left;Step-through pattern Gait velocity: decreased   General Gait Details: 3L [place] for ambulation, unable to get accurate oxygen saturation reading but it appears patient was desatting due to DOE noted. PT managed oxygen tank and tubing.     Balance Overall balance assessment: Needs assistance Sitting-balance support: Feet supported Sitting balance-Leahy Scale: Good     Standing balance support: Bilateral upper extremity supported Standing balance-Leahy Scale: Poor     Cognition Arousal/Alertness: Awake/alert Behavior During Therapy: WFL for tasks assessed/performed Overall Cognitive Status: Within Functional Limits for tasks assessed        General Comments General comments (skin integrity, edema, etc.): Patient on BSC at start of session on room air. Oxygen saturation 84% after transfer off of BSC to EOB. 2L initially but still in the high 80s% sitting EOB so 3L for ambulation. Unable to get accurate oxygen saturation response with ambulation trial in room. Patient declined ambulating in hallway due to knee pain and breathing. Unable to get accurate O2 saturation post ambulation trial but DOE noted. Patiented rated 7/10 SOB with ambulation. 1L [place] at end of session for continued recovery in bed. Nurse informed.      Pertinent Vitals/Pain Pain Assessment: 0-10 Pain Score: 8  Pain Location: L knee from arthritis Pain Intervention(s): Limited activity within patient's tolerance;Monitored during session;Repositioned  PT Goals (current goals can now be found in the care plan section) Progress towards PT goals: Progressing toward goals     Frequency    Min 3X/week      PT Plan Current plan remains appropriate       AM-PAC PT "6 Clicks" Mobility   Outcome Measure  Help needed turning from your back to your side while in a flat bed without using bedrails?: None Help needed moving from lying on your back to sitting on the side of a flat bed without using bedrails?: A Little Help needed moving to and from a bed to a chair (including a wheelchair)?: A Little Help needed standing up from a chair using your arms (e.g., wheelchair or bedside chair)?: A Little Help needed to walk in hospital room?: A Little Help needed climbing 3-5 steps with a railing? : Total 6 Click Score: 17    End of Session Equipment Utilized During Treatment: Oxygen Activity Tolerance: Patient limited by fatigue;Patient limited by pain Patient left: in bed;with call bell/phone within reach Nurse Communication: Mobility status;Other (comment)(oxygen saturation response) PT Visit Diagnosis: Unsteadiness on feet (R26.81);Other abnormalities of gait and mobility (R26.89)     Time: 0813-8871 PT Time Calculation (min) (ACUTE ONLY): 26 min  Charges:  $Gait Training: 8-22 mins $Therapeutic Activity: 8-22 mins                     Birdie Hopes, PT, DPT Acute Rehab [phone] office     Birdie Hopes 12/10/2019, 2:42 PM
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
## 2019-12-11 DIAGNOSIS — I10 Essential (primary) hypertension: Secondary | ICD-10-CM

## 2019-12-11 LAB — CBC WITH DIFFERENTIAL/PLATELET
Abs Immature Granulocytes: 0.18 10*3/uL — ABNORMAL HIGH (ref 0.00–0.07)
Basophils Absolute: 0 10*3/uL (ref 0.0–0.1)
Basophils Relative: 0 %
Eosinophils Absolute: 0 10*3/uL (ref 0.0–0.5)
Eosinophils Relative: 0 %
HCT: 40.3 % (ref 36.0–46.0)
Hemoglobin: 13.6 g/dL (ref 12.0–15.0)
Immature Granulocytes: 2 %
Lymphocytes Relative: 24 %
Lymphs Abs: 1.9 10*3/uL (ref 0.7–4.0)
MCH: 29.4 pg (ref 26.0–34.0)
MCHC: 33.7 g/dL (ref 30.0–36.0)
MCV: 87.2 fL (ref 80.0–100.0)
Monocytes Absolute: 0.6 10*3/uL (ref 0.1–1.0)
Monocytes Relative: 8 %
Neutro Abs: 5.2 10*3/uL (ref 1.7–7.7)
Neutrophils Relative %: 66 %
Platelets: 297 10*3/uL (ref 150–400)
RBC: 4.62 MIL/uL (ref 3.87–5.11)
RDW: 15.9 % — ABNORMAL HIGH (ref 11.5–15.5)
WBC: 8 10*3/uL (ref 4.0–10.5)
nRBC: 0 % (ref 0.0–0.2)

## 2019-12-11 LAB — C-REACTIVE PROTEIN: CRP: 2.4 mg/dL — ABNORMAL HIGH (ref <1.0)

## 2019-12-11 LAB — COMPREHENSIVE METABOLIC PANEL
ALT: 65 U/L — ABNORMAL HIGH (ref 0–44)
AST: 26 U/L (ref 15–41)
Albumin: 2.8 g/dL — ABNORMAL LOW (ref 3.5–5.0)
Alkaline Phosphatase: 34 U/L — ABNORMAL LOW (ref 38–126)
Anion gap: 13 (ref 5–15)
BUN: 58 mg/dL — ABNORMAL HIGH (ref 8–23)
CO2: 20 mmol/L — ABNORMAL LOW (ref 22–32)
Calcium: 9.9 mg/dL (ref 8.9–10.3)
Chloride: 102 mmol/L (ref 98–111)
Creatinine, Ser: 1.24 mg/dL — ABNORMAL HIGH (ref 0.44–1.00)
GFR calc Af Amer: 48 mL/min — ABNORMAL LOW (ref >60)
GFR calc non Af Amer: 41 mL/min — ABNORMAL LOW (ref >60)
Glucose, Bld: 124 mg/dL — ABNORMAL HIGH (ref 70–99)
Potassium: 4.9 mmol/L (ref 3.5–5.1)
Sodium: 135 mmol/L (ref 135–145)
Total Bilirubin: 0.8 mg/dL (ref 0.3–1.2)
Total Protein: 6.5 g/dL (ref 6.5–8.1)

## 2019-12-11 LAB — FERRITIN: Ferritin: 840 ng/mL — ABNORMAL HIGH (ref 11–307)

## 2019-12-11 LAB — D-DIMER, QUANTITATIVE: D-Dimer, Quant: 0.47 ug/mL-FEU (ref 0.00–0.50)

## 2019-12-11 MED ORDER — BENZONATATE 100 MG PO CAPS: 100.0000 mg | ORAL_CAPSULE | Freq: Four times a day (QID) | ORAL | 0 refills | Status: DC | PRN

## 2019-12-11 MED ORDER — ALBUTEROL SULFATE HFA 108 (90 BASE) MCG/ACT IN AERS: 2.0000 | INHALATION_SPRAY | Freq: Four times a day (QID) | RESPIRATORY_TRACT | 0 refills | Status: AC | PRN

## 2019-12-11 MED ORDER — PREDNISONE 10 MG PO TABS: ORAL_TABLET | ORAL | 0 refills | Status: DC

## 2019-12-11 MED ORDER — AMLODIPINE BESYLATE 10 MG PO TABS: 10.0000 mg | ORAL_TABLET | Freq: Every day | ORAL | 0 refills | Status: AC

## 2019-12-11 NOTE — Progress Notes (Signed)
Shelby Meza to be D/C'd Home per MD order. Discussed with the patient and all questions fully answered.  Allergies as of 12/11/2019      Reactions   Chocolate Palpitations      Medication List    STOP taking these medications   losartan 50 MG tablet Commonly known as: COZAAR     TAKE these medications   albuterol 108 (90 Base) MCG/ACT inhaler Commonly known as: VENTOLIN HFA Inhale 2 puffs into the lungs every 6 (six) hours as needed for wheezing or shortness of breath.   allopurinol 300 MG tablet Commonly known as: ZYLOPRIM Take 300 mg by mouth daily.   amLODipine 10 MG tablet Commonly known as: NORVASC Take 1 tablet (10 mg total) by mouth daily. What changed:   medication strength  how much to take   aspirin 81 MG chewable tablet Chew 81 mg by mouth daily.   atorvastatin 20 MG tablet Commonly known as: LIPITOR Take 1 tablet (20 mg total) by mouth daily at 6 PM.   BENGAY EX Apply 1 application topically as needed (for pain).   benzonatate 100 MG capsule Commonly known as: Tessalon Perles Take 1 capsule (100 mg total) by mouth every 6 (six) hours as needed for cough.   cyclobenzaprine 10 MG tablet Commonly known as: FLEXERIL Take 10 mg by mouth every 8 (eight) hours as needed for muscle spasms.   diphenhydrAMINE 25 MG tablet Commonly known as: BENADRYL Take 25 mg by mouth every 6 (six) hours as needed for itching.   LORazepam 1 MG tablet Commonly known as: ATIVAN Take 1 mg by mouth at bedtime as needed for anxiety.   Mucus Relief 400 MG Tabs tablet Generic drug: guaifenesin Take 400 mg by mouth every 6 (six) hours as needed (cold).   MULTIVITAMIN PO Take 1 tablet by mouth daily.   nitroGLYCERIN 0.4 MG SL tablet Commonly known as: NITROSTAT Place 0.4 mg under the tongue every 5 (five) minutes as needed for chest pain.   Omega 3 1000 MG Caps Take 1,000 mg by mouth daily.   predniSONE 10 MG tablet Commonly known as: DELTASONE Take 40 mg daily  for 1 day, 30 mg daily for 1 day, 20 mg daily for 1 days,10 mg daily for 1 day, then stop   sertraline 50 MG tablet Commonly known as: ZOLOFT Take 50 mg by mouth daily.   traMADol 50 MG tablet Commonly known as: ULTRAM Take 50 mg by mouth every 8 (eight) hours as needed for moderate pain.   vitamin B-12 1000 MCG tablet Commonly known as: CYANOCOBALAMIN Take 1,000 mcg by mouth daily.            Durable Medical Equipment  (From admission, onward)         Start     Ordered   12/11/19 1208  For home use only DME 4 wheeled rolling walker with seat  Once    Question:  Patient needs a walker to treat with the following condition  Answer:  Weakness   12/11/19 1208   12/10/19 1528  For home use only DME oxygen  Once    Question Answer Comment  Length of Need 6 Months   Mode or (Route) Nasal cannula   Liters per Minute 2   Frequency Continuous (stationary and portable oxygen unit needed)   Oxygen conserving device Yes   Oxygen delivery system Gas      12/10/19 1528          VVS,  Skin clean, dry and intact without evidence of skin break down, no evidence of skin tears noted.  IV catheter discontinued intact. Site without signs and symptoms of complications. Dressing and pressure applied.  An After Visit Summary was printed and given to the patient.  Patient escorted via [place], and D/C home via private auto.  Shelby Meza  12/11/2019 1:29 PM

## 2019-12-11 NOTE — Discharge Instructions (Signed)
Person Under Monitoring Name: Shelby Meza  Location: [address] [id number]   Infection Prevention Recommendations for Individuals Confirmed to have, or Being Evaluated for, 2019 Novel Coronavirus (COVID-19) Infection Who Receive Care at Home  Individuals who are confirmed to have, or are being evaluated for, COVID-19 should follow the prevention steps below until a healthcare provider or local or state health department says they can return to normal activities.  Stay home except to get medical care You should restrict activities outside your home, except for getting medical care. Do not go to work, school, or public areas, and do not use public transportation or taxis.  Call ahead before visiting your doctor Before your medical appointment, call the healthcare provider and tell them that you have, or are being evaluated for, COVID-19 infection. This will help the healthcare providers office take steps to keep other people from getting infected. Ask your healthcare provider to call the local or state health department.  Monitor your symptoms Seek prompt medical attention if your illness is worsening (e.g., difficulty breathing). Before going to your medical appointment, call the healthcare provider and tell them that you have, or are being evaluated for, COVID-19 infection. Ask your healthcare provider to call the local or state health department.  Wear a facemask You should wear a facemask that covers your nose and mouth when you are in the same room with other people and when you visit a healthcare provider. People who live with or visit you should also wear a facemask while they are in the same room with you.  Separate yourself from other people in your home As much as possible, you should stay in a different room from other people in your home. Also, you should use a separate bathroom, if available.  Avoid sharing household items You should not  share dishes, drinking glasses, cups, eating utensils, towels, bedding, or other items with other people in your home. After using these items, you should wash them thoroughly with soap and water.  Cover your coughs and sneezes Cover your mouth and nose with a tissue when you cough or sneeze, or you can cough or sneeze into your sleeve. Throw used tissues in a lined trash can, and immediately wash your hands with soap and water for at least 20 seconds or use an alcohol-based hand rub.  Wash your [facility] your hands often and thoroughly with soap and water for at least 20 seconds. You can use an alcohol-based hand sanitizer if soap and water are not available and if your hands are not visibly dirty. Avoid touching your eyes, nose, and mouth with unwashed hands.   Prevention Steps for Caregivers and Household Members of Individuals Confirmed to have, or Being Evaluated for, COVID-19 Infection Being Cared for in the Home  If you live with, or provide care at home for, a person confirmed to have, or being evaluated for, COVID-19 infection please follow these guidelines to prevent infection:  Follow healthcare providers instructions Make sure that you understand and can help the patient follow any healthcare provider instructions for all care.  Provide for the patients basic needs You should help the patient with basic needs in the home and provide support for getting groceries, prescriptions, and other personal needs.  Monitor the patients symptoms If they are getting sicker, call his or her medical provider and tell them that the patient has, or is being evaluated for, COVID-19 infection. This will help the healthcare  providers office take steps to keep other people from getting infected. Ask the healthcare provider to call the local or state health department.  Limit the number of people who have contact with the patient  If possible, have only one caregiver for the  patient.  Other household members should stay in another home or place of residence. If this is not possible, they should stay  in another room, or be separated from the patient as much as possible. Use a separate bathroom, if available.  Restrict visitors who do not have an essential need to be in the home.  Keep older adults, very young children, and other sick people away from the patient Keep older adults, very young children, and those who have compromised immune systems or chronic health conditions away from the patient. This includes people with chronic heart, lung, or kidney conditions, diabetes, and cancer.  Ensure good ventilation Make sure that shared spaces in the home have good air flow, such as from an air conditioner or an opened window, weather permitting.  Wash your hands often  Wash your hands often and thoroughly with soap and water for at least 20 seconds. You can use an alcohol based hand sanitizer if soap and water are not available and if your hands are not visibly dirty.  Avoid touching your eyes, nose, and mouth with unwashed hands.  Use disposable paper towels to dry your hands. If not available, use dedicated cloth towels and replace them when they become wet.  Wear a facemask and gloves  Wear a disposable facemask at all times in the room and gloves when you touch or have contact with the patients blood, body fluids, and/or secretions or excretions, such as sweat, saliva, sputum, nasal mucus, vomit, urine, or feces.  Ensure the mask fits over your nose and mouth tightly, and do not touch it during use.  Throw out disposable facemasks and gloves after using them. Do not reuse.  Wash your hands immediately after removing your facemask and gloves.  If your personal clothing becomes contaminated, carefully remove clothing and launder. Wash your hands after handling contaminated clothing.  Place all used disposable facemasks, gloves, and other waste in a lined  container before disposing them with other household waste.  Remove gloves and wash your hands immediately after handling these items.  Do not share dishes, glasses, or other household items with the patient  Avoid sharing household items. You should not share dishes, drinking glasses, cups, eating utensils, towels, bedding, or other items with a patient who is confirmed to have, or being evaluated for, COVID-19 infection.  After the person uses these items, you should wash them thoroughly with soap and water.  Wash laundry thoroughly  Immediately remove and wash clothes or bedding that have blood, body fluids, and/or secretions or excretions, such as sweat, saliva, sputum, nasal mucus, vomit, urine, or feces, on them.  Wear gloves when handling laundry from the patient.  Read and follow directions on labels of laundry or clothing items and detergent. In general, wash and dry with the warmest temperatures recommended on the label.  Clean all areas the individual has used often  Clean all touchable surfaces, such as counters, tabletops, doorknobs, bathroom fixtures, toilets, phones, keyboards, tablets, and bedside tables, every day. Also, clean any surfaces that may have blood, body fluids, and/or secretions or excretions on them.  Wear gloves when cleaning surfaces the patient has come in contact with.  Use a diluted bleach solution (e.g., dilute bleach with  1 part bleach and 10 parts water) or a household disinfectant with a label that says EPA-registered for coronaviruses. To make a bleach solution at home, add 1 tablespoon of bleach to 1 quart (4 cups) of water. For a larger supply, add  cup of bleach to 1 gallon (16 cups) of water.  Read labels of cleaning products and follow recommendations provided on product labels. Labels contain instructions for safe and effective use of the cleaning product including precautions you should take when applying the product, such as wearing gloves or  eye protection and making sure you have good ventilation during use of the product.  Remove gloves and wash hands immediately after cleaning.  Monitor yourself for signs and symptoms of illness Caregivers and household members are considered close contacts, should monitor their health, and will be asked to limit movement outside of the home to the extent possible. Follow the monitoring steps for close contacts listed on the symptom monitoring form.   ? If you have additional questions, contact your local health department or call the epidemiologist on call at [phone] (available 24/7). ? This guidance is subject to change. For the most up-to-date guidance from [facility], please refer to their website: YouBlogs.pl

## 2019-12-11 NOTE — Discharge Summary (Signed)
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 must read complete instructions/literature along with all the possible adverse reactions/side effects for all the Medicines you take and that have been prescribed to you. Take any new Medicines after you have completely understood and accpet all the possible adverse reactions/side effects.   Do not drive when taking Pain medications or sleeping medications (Benzodaizepines)  Do not take more than prescribed Pain, Sleep and Anxiety Medications. It is not advisable to combine anxiety,sleep and pain medications without talking with your primary care practitioner  Special Instructions: If you have smoked or  chewed Tobacco  in the last 2 yrs please stop smoking, stop any regular Alcohol  and or any Recreational drug use.  Wear Seat belts while driving.  Please note: You were cared for by a hospitalist during your hospital stay. Once you are discharged, your primary care physician will handle any further medical issues. Please note that NO REFILLS for any discharge medications will be authorized once you are discharged, as it is imperative that you return to your primary care physician (or establish a relationship with a primary care physician if you do not have one) for your post hospital discharge needs so that they can reassess your need for medications and monitor your lab values.  Total Time spent coordinating discharge including counseling, education and face to face time equals 35 minutes.  SignedOren Binet 12/11/2019 10:37 AM

## 2019-12-11 NOTE — TOC Transition Note (Addendum)
Transition of Care [facility]) - CM/SW Discharge Note   Patient Details  Name: Shelby Meza MRN: [id number] Date of Birth: Jan 02, 1941  Transition of [facility]) CM/SW Contact:  Shelby Collet, RN Phone Number: 12/11/2019, 10:58 AM   Clinical Narrative:   Notified [facility] and [facility]  of DC today.  [facility] to verify Oxygen in room and that home set up has been arranged. [place] address per last TOC note after speaking w family.   Family will provide transport home, rollator to be delivered to the house MOnday with the O2 refill tank    Final next level of care: [place] Barriers to Discharge: Continued Medical Work up   Patient Goals and CMS Choice Patient states their goals for this hospitalization and ongoing recovery are:: To get well enough to go home. CMS Medicare.gov Compare Post Acute Care list provided to:: Other (Comment Required)(Jessie McNair, Daughter) Choice offered to / list presented to : Adult Children  Discharge Placement                       Discharge Plan and Services In-house Referral: Clinical Social Work Discharge Planning Services: CM Consult Post Acute Care Choice: Durable Medical Equipment, Home Health                    HH Arranged: PT, OT, Refused SNF [place] Date [place]: 12/08/19 Time [place]: [phone] Representative spoke with at [place]: Cassie  Social Determinants of Health ([place]) Interventions     Readmission Risk Interventions No flowsheet data found.

## 2020-08-03 ENCOUNTER — Other Ambulatory Visit: Payer: Self-pay | Admitting: Cardiology

## 2020-08-03 DIAGNOSIS — Z1231 Encounter for screening mammogram for malignant neoplasm of breast: Secondary | ICD-10-CM

## 2020-09-07 ENCOUNTER — Ambulatory Visit
Admission: EM | Admit: 2020-09-07 | Discharge: 2020-09-07 | Disposition: A | Discharge status: Home / Self Care | Payer: Medicare Other | Attending: Emergency Medicine | Admitting: Emergency Medicine

## 2020-09-07 ENCOUNTER — Other Ambulatory Visit: Payer: Self-pay

## 2020-09-07 ENCOUNTER — Ambulatory Visit (INDEPENDENT_AMBULATORY_CARE_PROVIDER_SITE_OTHER): Payer: Medicare Other

## 2020-09-07 DIAGNOSIS — R0781 Pleurodynia: Secondary | ICD-10-CM

## 2020-09-07 DIAGNOSIS — M25512 Pain in left shoulder: Secondary | ICD-10-CM

## 2020-09-07 DIAGNOSIS — R0789 Other chest pain: Secondary | ICD-10-CM

## 2020-09-07 MED ORDER — PREDNISONE 10 MG PO TABS: 30.0000 mg | ORAL_TABLET | Freq: Every day | ORAL | 0 refills | Status: AC

## 2020-09-07 NOTE — Discharge Instructions (Signed)
X-ray normal, no signs of pneumonia, no rib fractures Plan prednisone 30 mg daily for the next week, take with food in the morning May continue your Tylenol, please take at a different times and with the prednisone Continue tramadol as prescribed as needed May also continue Flexeril, use sparingly with tramadol as both may cause drowsiness Gentle range of motion exercises of shoulder Please follow-up if not improving or worsening
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
## 2020-10-03 ENCOUNTER — Encounter: Payer: Self-pay | Admitting: Family

## 2020-10-03 ENCOUNTER — Ambulatory Visit (INDEPENDENT_AMBULATORY_CARE_PROVIDER_SITE_OTHER): Payer: Medicare Other | Admitting: Family

## 2020-10-03 ENCOUNTER — Other Ambulatory Visit: Payer: Self-pay

## 2020-10-03 VITALS — BP 131/82 | HR 73 | Ht 61.46 in | Wt 236.2 lb

## 2020-10-03 DIAGNOSIS — I10 Essential (primary) hypertension: Secondary | ICD-10-CM | POA: Insufficient documentation

## 2020-10-03 DIAGNOSIS — M25512 Pain in left shoulder: Secondary | ICD-10-CM | POA: Diagnosis not present

## 2020-10-03 DIAGNOSIS — K625 Hemorrhage of anus and rectum: Secondary | ICD-10-CM | POA: Insufficient documentation

## 2020-10-03 DIAGNOSIS — Z7689 Persons encountering health services in other specified circumstances: Secondary | ICD-10-CM

## 2020-10-03 DIAGNOSIS — K219 Gastro-esophageal reflux disease without esophagitis: Secondary | ICD-10-CM | POA: Insufficient documentation

## 2020-10-03 MED ORDER — PREDNISONE 20 MG PO TABS: 20.0000 mg | ORAL_TABLET | Freq: Every day | ORAL | 0 refills | Status: AC
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
## 2020-10-03 NOTE — Patient Instructions (Signed)
Return for annual physical examination, labs, and health maintenance. Arrive fasting meaning having had no food and/or nothing to drink for at least 8 hours prior to appointment. Prednisone taper left shoulder pain Keep appointments with Cardiology. Thank you for choosing Primary Care at [facility] for your medical home!    Shelby Meza was seen by Camillia Herter, NP today.   Shelby Meza's primary care provider is Charolette Forward, MD.   For the best care possible,  you should try to see Durene Fruits, NP whenever you come to clinic.   We look forward to seeing you again soon!  If you have any questions about your visit today,  please call us at [phone]  Or feel free to reach your provider via [place].    Hypertension, Adult Hypertension is another name for high blood pressure. High blood pressure forces your heart to work harder to pump blood. This can cause problems over time. There are two numbers in a blood pressure reading. There is a top number (systolic) over a bottom number (diastolic). It is best to have a blood pressure that is below 120/80. Healthy choices can help lower your blood pressure, or you may need medicine to help lower it. What are the causes? The cause of this condition is not known. Some conditions may be related to high blood pressure. What increases the risk?  Smoking.  Having type 2 diabetes mellitus, high cholesterol, or both.  Not getting enough exercise or physical activity.  Being overweight.  Having too much fat, sugar, calories, or salt (sodium) in your diet.  Drinking too much alcohol.  Having long-term (chronic) kidney disease.  Having a family history of high blood pressure.  Age. Risk increases with age.  Race. You may be at higher risk if you are African American.  Gender. Men are at higher risk than women before age 61. After age 37, women are at higher risk than men.  Having obstructive sleep  apnea.  Stress. What are the signs or symptoms?  High blood pressure may not cause symptoms. Very high blood pressure (hypertensive crisis) may cause: ? Headache. ? Feelings of worry or nervousness (anxiety). ? Shortness of breath. ? Nosebleed. ? A feeling of being sick to your stomach (nausea). ? Throwing up (vomiting). ? Changes in how you see. ? Very bad chest pain. ? Seizures. How is this treated?  This condition is treated by making healthy lifestyle changes, such as: ? Eating healthy foods. ? Exercising more. ? Drinking less alcohol.  Your health care provider may prescribe medicine if lifestyle changes are not enough to get your blood pressure under control, and if: ? Your top number is above 130. ? Your bottom number is above 80.  Your personal target blood pressure may vary. Follow these instructions at home: Eating and drinking  If told, follow the DASH eating plan. To follow this plan: ? Fill one half of your plate at each meal with fruits and vegetables. ? Fill one fourth of your plate at each meal with whole grains. Whole grains include whole-wheat pasta, brown rice, and whole-grain bread. ? Eat or drink low-fat dairy products, such as skim milk or low-fat yogurt. ? Fill one fourth of your plate at each meal with low-fat (lean) proteins. Low-fat proteins include fish, chicken without skin, eggs, beans, and tofu. ? Avoid fatty meat, cured and processed meat, or chicken with skin. ? Avoid pre-made or processed food.  Eat less than 1,500 mg of  salt each day.  Do not drink alcohol if: ? Your doctor tells you not to drink. ? You are pregnant, may be pregnant, or are planning to become pregnant.  If you drink alcohol: ? Limit how much you use to:  0-1 drink a day for women.  0-2 drinks a day for men. ? Be aware of how much alcohol is in your drink. In the [place] one drink equals one 12 oz bottle of beer (355 mL), one 5 oz glass of wine (148 mL), or one 1 oz  glass of hard liquor (44 mL).   Lifestyle  Work with your doctor to stay at a healthy weight or to lose weight. Ask your doctor what the best weight is for you.  Get at least 30 minutes of exercise most days of the week. This may include walking, swimming, or biking.  Get at least 30 minutes of exercise that strengthens your muscles (resistance exercise) at least 3 days a week. This may include lifting weights or doing Pilates.  Do not use any products that contain nicotine or tobacco, such as cigarettes, e-cigarettes, and chewing tobacco. If you need help quitting, ask your doctor.  Check your blood pressure at home as told by your doctor.  Keep all follow-up visits as told by your doctor. This is important.   Medicines  Take over-the-counter and prescription medicines only as told by your doctor. Follow directions carefully.  Do not skip doses of blood pressure medicine. The medicine does not work as well if you skip doses. Skipping doses also puts you at risk for problems.  Ask your doctor about side effects or reactions to medicines that you should watch for. Contact a doctor if you:  Think you are having a reaction to the medicine you are taking.  Have headaches that keep coming back (recurring).  Feel dizzy.  Have swelling in your ankles.  Have trouble with your vision. Get help right away if you:  Get a very bad headache.  Start to feel mixed up (confused).  Feel weak or numb.  Feel faint.  Have very bad pain in your: ? Chest. ? Belly (abdomen).  Throw up more than once.  Have trouble breathing. Summary  Hypertension is another name for high blood pressure.  High blood pressure forces your heart to work harder to pump blood.  For most people, a normal blood pressure is less than 120/80.  Making healthy choices can help lower blood pressure. If your blood pressure does not get lower with healthy choices, you may need to take medicine. This information  is not intended to replace advice given to you by your health care provider. Make sure you discuss any questions you have with your health care provider. Document Revised: 04/22/2018 Document Reviewed: 04/22/2018 Elsevier Patient Education  2021 [employer].

## 2020-10-03 NOTE — Progress Notes (Signed)
Establish care

## 2020-10-05 ENCOUNTER — Other Ambulatory Visit: Payer: Self-pay

## 2020-10-05 ENCOUNTER — Ambulatory Visit
Admission: RE | Admit: 2020-10-05 | Discharge: 2020-10-05 | Disposition: A | Discharge status: Home / Self Care | Payer: Medicare Other | Source: Ambulatory Visit | Attending: Cardiology | Admitting: Cardiology

## 2020-10-05 DIAGNOSIS — Z1231 Encounter for screening mammogram for malignant neoplasm of breast: Secondary | ICD-10-CM

## 2020-11-01 ENCOUNTER — Ambulatory Visit (INDEPENDENT_AMBULATORY_CARE_PROVIDER_SITE_OTHER): Payer: Medicare Other | Admitting: Family

## 2020-11-01 ENCOUNTER — Encounter: Payer: Self-pay | Admitting: Family

## 2020-11-01 ENCOUNTER — Other Ambulatory Visit: Payer: Self-pay

## 2020-11-01 VITALS — BP 110/74 | HR 82 | Ht 61.46 in | Wt 237.6 lb

## 2020-11-01 DIAGNOSIS — Z23 Encounter for immunization: Secondary | ICD-10-CM

## 2020-11-01 DIAGNOSIS — Z0000 Encounter for general adult medical examination without abnormal findings: Secondary | ICD-10-CM | POA: Diagnosis not present

## 2020-11-01 DIAGNOSIS — Z13220 Encounter for screening for lipoid disorders: Secondary | ICD-10-CM

## 2020-11-01 DIAGNOSIS — Z131 Encounter for screening for diabetes mellitus: Secondary | ICD-10-CM | POA: Diagnosis not present

## 2020-11-01 DIAGNOSIS — Z130 Encounter for screening for diseases of the blood and blood-forming organs and certain disorders involving the immune mechanism: Secondary | ICD-10-CM

## 2020-11-01 DIAGNOSIS — Z13228 Encounter for screening for other metabolic disorders: Secondary | ICD-10-CM | POA: Diagnosis not present

## 2020-11-01 DIAGNOSIS — Z1329 Encounter for screening for other suspected endocrine disorder: Secondary | ICD-10-CM

## 2020-11-01 NOTE — Progress Notes (Signed)
Medicare wellness

## 2020-11-01 NOTE — Patient Instructions (Signed)
  Shelby Meza , Thank you for taking time to come for your Medicare Wellness Visit. I appreciate your ongoing commitment to your health goals. Please review the following plan we discussed and let me know if I can assist you in the future.   These are the goals we discussed: Goals   None     This is a list of the screening recommended for you and due dates:  Health Maintenance  Topic Date Due  . Tetanus Vaccine  Never done  . Pneumonia vaccines (1 of 2 - PCV13) Never done  . Flu Shot  Completed  . DEXA scan (bone density measurement)  Completed  . COVID-19 Vaccine  Completed  . HPV Vaccine  Aged Out
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
## 2020-11-02 LAB — COMPREHENSIVE METABOLIC PANEL
ALT: 22 IU/L (ref 0–32)
AST: 21 IU/L (ref 0–40)
Albumin/Globulin Ratio: 1.6 (ref 1.2–2.2)
Albumin: 4.3 g/dL (ref 3.7–4.7)
Alkaline Phosphatase: 70 IU/L (ref 44–121)
BUN/Creatinine Ratio: 22 (ref 12–28)
BUN: 22 mg/dL (ref 8–27)
Bilirubin Total: 0.4 mg/dL (ref 0.0–1.2)
CO2: 21 mmol/L (ref 20–29)
Calcium: 10.1 mg/dL (ref 8.7–10.3)
Chloride: 104 mmol/L (ref 96–106)
Creatinine, Ser: 1 mg/dL (ref 0.57–1.00)
Globulin, Total: 2.7 g/dL (ref 1.5–4.5)
Glucose: 99 mg/dL (ref 65–99)
Potassium: 4.2 mmol/L (ref 3.5–5.2)
Sodium: 140 mmol/L (ref 134–144)
Total Protein: 7 g/dL (ref 6.0–8.5)
eGFR: 57 mL/min/{1.73_m2} — ABNORMAL LOW (ref >59)

## 2020-11-02 LAB — CBC
Hematocrit: 40.4 % (ref 34.0–46.6)
Hemoglobin: 13.3 g/dL (ref 11.1–15.9)
MCH: 29.5 pg (ref 26.6–33.0)
MCHC: 32.9 g/dL (ref 31.5–35.7)
MCV: 90 fL (ref 79–97)
Platelets: 186 10*3/uL (ref 150–450)
RBC: 4.51 x10E6/uL (ref 3.77–5.28)
RDW: 15.7 % — ABNORMAL HIGH (ref 11.7–15.4)
WBC: 7 10*3/uL (ref 3.4–10.8)

## 2020-11-02 LAB — LIPID PANEL
Chol/HDL Ratio: 4 ratio (ref 0.0–4.4)
Cholesterol, Total: 224 mg/dL — ABNORMAL HIGH (ref 100–199)
HDL: 56 mg/dL (ref >39)
LDL Chol Calc (NIH): 150 mg/dL — ABNORMAL HIGH (ref 0–99)
Triglycerides: 104 mg/dL (ref 0–149)
VLDL Cholesterol Cal: 18 mg/dL (ref 5–40)

## 2020-11-02 LAB — HEMOGLOBIN A1C
Est. average glucose Bld gHb Est-mCnc: 120 mg/dL
Hgb A1c MFr Bld: 5.8 % — ABNORMAL HIGH (ref 4.8–5.6)

## 2020-11-02 LAB — TSH+T4F+T3FREE
Free T4: 1.11 ng/dL (ref 0.82–1.77)
T3, Free: 3 pg/mL (ref 2.0–4.4)
TSH: 2.35 u[IU]/mL (ref 0.450–4.500)

## 2020-11-02 NOTE — Progress Notes (Signed)
No anemia.   Thyroid normal.   Liver function normal.   Kidney function not 100% but improved since April 2021.   Cholesterol higher than expected. High cholesterol may increase risk of heart attack and/or stroke. Consider eating more fruits, vegetables, and lean baked meats such as chicken or fish. Moderate intensity exercise at least 150 minutes as tolerated per week may help as well.   Please confirm if patient is still taking Atorvastatin 20 mg daily for high cholesterol and if she needs refills for this.  Hemoglobin A1c is consistent with pre-diabetes. Practice healthy eating habits of fresh fruit and vegetables, lean baked meats such as chicken, fish, and [place]; limit breads, rice, pastas, and desserts; practice regular aerobic exercise (at least 150 minutes a week as tolerated) and will recheck in about 6 months.

## 2020-12-05 ENCOUNTER — Ambulatory Visit
Admission: EM | Admit: 2020-12-05 | Discharge: 2020-12-05 | Disposition: A | Discharge status: Home / Self Care | Payer: Medicare Other | Source: Home / Self Care

## 2020-12-05 ENCOUNTER — Other Ambulatory Visit: Payer: Self-pay

## 2020-12-05 ENCOUNTER — Emergency Department (HOSPITAL_BASED_OUTPATIENT_CLINIC_OR_DEPARTMENT_OTHER)
Admission: EM | Admit: 2020-12-05 | Discharge: 2020-12-05 | Disposition: A | Discharge status: Home / Self Care | Payer: Medicare Other | Attending: Emergency Medicine | Admitting: Emergency Medicine

## 2020-12-05 ENCOUNTER — Encounter (HOSPITAL_BASED_OUTPATIENT_CLINIC_OR_DEPARTMENT_OTHER): Payer: Self-pay | Admitting: Emergency Medicine

## 2020-12-05 ENCOUNTER — Emergency Department (HOSPITAL_BASED_OUTPATIENT_CLINIC_OR_DEPARTMENT_OTHER): Payer: Medicare Other

## 2020-12-05 DIAGNOSIS — R079 Chest pain, unspecified: Secondary | ICD-10-CM

## 2020-12-05 DIAGNOSIS — Z8616 Personal history of COVID-19: Secondary | ICD-10-CM | POA: Insufficient documentation

## 2020-12-05 DIAGNOSIS — Z79899 Other long term (current) drug therapy: Secondary | ICD-10-CM | POA: Insufficient documentation

## 2020-12-05 DIAGNOSIS — Z7982 Long term (current) use of aspirin: Secondary | ICD-10-CM | POA: Insufficient documentation

## 2020-12-05 DIAGNOSIS — K219 Gastro-esophageal reflux disease without esophagitis: Secondary | ICD-10-CM | POA: Insufficient documentation

## 2020-12-05 DIAGNOSIS — I2510 Atherosclerotic heart disease of native coronary artery without angina pectoris: Secondary | ICD-10-CM | POA: Insufficient documentation

## 2020-12-05 DIAGNOSIS — R609 Edema, unspecified: Secondary | ICD-10-CM | POA: Diagnosis not present

## 2020-12-05 DIAGNOSIS — I10 Essential (primary) hypertension: Secondary | ICD-10-CM | POA: Insufficient documentation

## 2020-12-05 DIAGNOSIS — R072 Precordial pain: Secondary | ICD-10-CM | POA: Insufficient documentation

## 2020-12-05 HISTORY — DX: Diaphragmatic hernia without obstruction or gangrene: K44.9

## 2020-12-05 LAB — CBC
HCT: 42.1 % (ref 36.0–46.0)
Hemoglobin: 13.8 g/dL (ref 12.0–15.0)
MCH: 29.7 pg (ref 26.0–34.0)
MCHC: 32.8 g/dL (ref 30.0–36.0)
MCV: 90.5 fL (ref 80.0–100.0)
Platelets: 218 10*3/uL (ref 150–400)
RBC: 4.65 MIL/uL (ref 3.87–5.11)
RDW: 15.9 % — ABNORMAL HIGH (ref 11.5–15.5)
WBC: 10.5 10*3/uL (ref 4.0–10.5)
nRBC: 0 % (ref 0.0–0.2)

## 2020-12-05 LAB — BASIC METABOLIC PANEL
Anion gap: 12 (ref 5–15)
BUN: 20 mg/dL (ref 8–23)
CO2: 23 mmol/L (ref 22–32)
Calcium: 10.1 mg/dL (ref 8.9–10.3)
Chloride: 103 mmol/L (ref 98–111)
Creatinine, Ser: 1.02 mg/dL — ABNORMAL HIGH (ref 0.44–1.00)
GFR, Estimated: 55 mL/min — ABNORMAL LOW (ref >60)
Glucose, Bld: 145 mg/dL — ABNORMAL HIGH (ref 70–99)
Potassium: 4.4 mmol/L (ref 3.5–5.1)
Sodium: 138 mmol/L (ref 135–145)

## 2020-12-05 LAB — TROPONIN I (HIGH SENSITIVITY)
Troponin I (High Sensitivity): 11 ng/L (ref <18)
Troponin I (High Sensitivity): 10 ng/L (ref <18)

## 2020-12-05 MED ORDER — AMLODIPINE BESYLATE 5 MG PO TABS
10.0000 mg | ORAL_TABLET | Freq: Every day | ORAL | Status: DC
  Administered 2020-12-05: 10 mg via ORAL
  Filled 2020-12-05: qty 2

## 2020-12-05 MED ORDER — TRAMADOL HCL 50 MG PO TABS
50.0000 mg | ORAL_TABLET | Freq: Once | ORAL | Status: AC
  Administered 2020-12-05: 50 mg via ORAL
  Filled 2020-12-05: qty 1

## 2020-12-05 MED ORDER — LOSARTAN POTASSIUM 25 MG PO TABS
50.0000 mg | ORAL_TABLET | Freq: Every day | ORAL | Status: DC
  Administered 2020-12-05: 50 mg via ORAL
  Filled 2020-12-05: qty 2

## 2020-12-05 MED ORDER — NITROGLYCERIN 0.4 MG SL SUBL: 0.4000 mg | SUBLINGUAL_TABLET | SUBLINGUAL | Status: DC | PRN
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
## 2020-12-05 NOTE — ED Triage Notes (Addendum)
Slid in the bathtub this past week end  And then had some stress this weekend , started  To have cp last night , no n/v/sob , has pain in shoulder blades , took a nitro last night did not help

## 2020-12-05 NOTE — Discharge Instructions (Signed)
You were seen in the emergency department for some chest pain.  You had blood work EKG and a chest x-ray that did not show an obvious explanation for your symptoms.  Please contact your cardiologist for close follow-up.  Continue your regular medications.  Return to the emergency department if any worsening or concerning symptoms.

## 2020-12-05 NOTE — Discharge Instructions (Signed)
Please go to emergency room

## 2020-12-05 NOTE — ED Triage Notes (Signed)
Pt states that she has some chest pain. x1 day
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
## 2020-12-29 ENCOUNTER — Ambulatory Visit (INDEPENDENT_AMBULATORY_CARE_PROVIDER_SITE_OTHER): Payer: Medicare Other

## 2020-12-29 ENCOUNTER — Other Ambulatory Visit: Payer: Self-pay

## 2020-12-29 DIAGNOSIS — Z23 Encounter for immunization: Secondary | ICD-10-CM | POA: Diagnosis not present

## 2020-12-29 NOTE — Progress Notes (Signed)
Pneumococcal 23 administered in right deltoid

## 2021-04-05 DIAGNOSIS — M109 Gout, unspecified: Secondary | ICD-10-CM | POA: Diagnosis not present

## 2021-04-05 DIAGNOSIS — I10 Essential (primary) hypertension: Secondary | ICD-10-CM | POA: Diagnosis not present

## 2021-04-05 DIAGNOSIS — E785 Hyperlipidemia, unspecified: Secondary | ICD-10-CM | POA: Diagnosis not present

## 2021-04-05 DIAGNOSIS — R0789 Other chest pain: Secondary | ICD-10-CM | POA: Diagnosis not present

## 2021-04-05 DIAGNOSIS — I25118 Atherosclerotic heart disease of native coronary artery with other forms of angina pectoris: Secondary | ICD-10-CM | POA: Diagnosis not present

## 2021-06-14 NOTE — Progress Notes (Signed)
Erroneous encounter

## 2021-06-18 ENCOUNTER — Encounter: Payer: Medicare Other | Admitting: Family

## 2021-06-28 DIAGNOSIS — H524 Presbyopia: Secondary | ICD-10-CM | POA: Diagnosis not present

## 2021-07-03 DIAGNOSIS — E785 Hyperlipidemia, unspecified: Secondary | ICD-10-CM | POA: Diagnosis not present

## 2021-07-03 DIAGNOSIS — M109 Gout, unspecified: Secondary | ICD-10-CM | POA: Diagnosis not present

## 2021-07-03 DIAGNOSIS — I10 Essential (primary) hypertension: Secondary | ICD-10-CM | POA: Diagnosis not present

## 2021-07-05 DIAGNOSIS — I10 Essential (primary) hypertension: Secondary | ICD-10-CM | POA: Diagnosis not present

## 2021-07-05 DIAGNOSIS — I25118 Atherosclerotic heart disease of native coronary artery with other forms of angina pectoris: Secondary | ICD-10-CM | POA: Diagnosis not present

## 2021-07-05 DIAGNOSIS — E785 Hyperlipidemia, unspecified: Secondary | ICD-10-CM | POA: Diagnosis not present

## 2021-07-10 DIAGNOSIS — I10 Essential (primary) hypertension: Secondary | ICD-10-CM | POA: Diagnosis not present
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
## 2021-08-08 ENCOUNTER — Ambulatory Visit: Payer: Medicare Other | Admitting: Family

## 2021-08-08 NOTE — Progress Notes (Signed)
Erroneous encounter

## 2021-08-09 ENCOUNTER — Ambulatory Visit (INDEPENDENT_AMBULATORY_CARE_PROVIDER_SITE_OTHER): Payer: Medicare Other | Admitting: Nurse Practitioner

## 2021-08-09 ENCOUNTER — Other Ambulatory Visit: Payer: Self-pay

## 2021-08-09 ENCOUNTER — Encounter: Payer: Medicare Other | Admitting: Family

## 2021-08-09 VITALS — BP 138/84 | HR 75

## 2021-08-09 DIAGNOSIS — K5900 Constipation, unspecified: Secondary | ICD-10-CM | POA: Diagnosis not present

## 2021-08-09 MED ORDER — DOCUSATE SODIUM 100 MG PO CAPS: 100.0000 mg | ORAL_CAPSULE | Freq: Two times a day (BID) | ORAL | 0 refills | Status: AC

## 2021-08-09 MED ORDER — POLYETHYLENE GLYCOL 3350 17 GM/SCOOP PO POWD: 17.0000 g | Freq: Two times a day (BID) | ORAL | 1 refills | Status: AC | PRN

## 2021-08-09 NOTE — Patient Instructions (Addendum)
LLQ abdominal pain Constipation:  Will order colace  Will order miralax  Stay as active as possible  May start probiotic  Follow up:  Follow up in 2 weeks with PCP

## 2021-08-09 NOTE — Progress Notes (Signed)
@Patient  ID: Shelby Meza, female    DOB: 06/17/1941, 80 y.o.   MRN: [id number]  Chief Complaint  Patient presents with   Abdominal Pain    Referring provider: Camillia Herter, NP   HPI  Patient presents today for left lower quadrant abdominal pain.  She states his symptoms started 1 week ago.  She states that she has been having issues with constipation over the past few weeks.  She states that she did have a bowel movement yesterday but it was small and hard.  She states that she has not noticed any blood in her stool.  She has been a little nauseated. Denies f/c/s, n/v/d, hemoptysis, PND, chest pain or edema.        Allergies  Allergen Reactions   Chocolate Palpitations    Immunization History  Administered Date(s) Administered   Influenza-Unspecified 07/10/2020   Moderna Sars-Covid-2 Vaccination 01/21/2020, 02/18/2020, 08/23/2020   Pneumococcal Conjugate-13 11/01/2020   Pneumococcal Polysaccharide-23 12/29/2020   Tdap 11/01/2020    Past Medical History:  Diagnosis Date   Arthritis    Coronary artery disease    Gout    Hiatal hernia    Hypertension     Tobacco History: Social History   Tobacco Use  Smoking Status Never  Smokeless Tobacco Never   Counseling given: Yes   Outpatient Encounter Medications as of 08/09/2021  Medication Sig   docusate sodium (COLACE) 100 MG capsule Take 1 capsule (100 mg total) by mouth 2 (two) times daily.   polyethylene glycol powder (GLYCOLAX/MIRALAX) 17 GM/SCOOP powder Take 17 g by mouth 2 (two) times daily as needed.   albuterol (VENTOLIN HFA) 108 (90 Base) MCG/ACT inhaler Inhale 2 puffs into the lungs every 6 (six) hours as needed for wheezing or shortness of breath.   allopurinol (ZYLOPRIM) 300 MG tablet Take 300 mg by mouth daily.   amLODipine (NORVASC) 10 MG tablet Take 1 tablet (10 mg total) by mouth daily.   aspirin 81 MG chewable tablet Chew 81 mg by mouth daily.   atorvastatin (LIPITOR) 20 MG tablet Take 1  tablet (20 mg total) by mouth daily at 6 PM.   cyclobenzaprine (FLEXERIL) 10 MG tablet Take 10 mg by mouth every 8 (eight) hours as needed for muscle spasms.    losartan (COZAAR) 50 MG tablet Take 50 mg by mouth daily.   Menthol, Topical Analgesic, (BENGAY EX) Apply 1 application topically as needed (for pain).   Multiple Vitamins-Minerals (MULTIVITAMIN PO) Take 1 tablet by mouth daily.   nitroGLYCERIN (NITROSTAT) 0.3 MG SL tablet    Omega-3 Fatty Acids (FISH OIL) 1000 MG CAPS    sertraline (ZOLOFT) 50 MG tablet Take 50 mg by mouth daily.   traMADol (ULTRAM) 50 MG tablet Take 50 mg by mouth every 8 (eight) hours as needed for moderate pain.   vitamin B-12 (CYANOCOBALAMIN) 1000 MCG tablet Take 1,000 mcg by mouth daily.   No facility-administered encounter medications on file as of 08/09/2021.     Review of Systems  Review of Systems  Constitutional: Negative.  Negative for fever.  HENT: Negative.    Cardiovascular: Negative.   Gastrointestinal:  Positive for abdominal pain, constipation and nausea. Negative for abdominal distention, anal bleeding, blood in stool and vomiting.  Allergic/Immunologic: Negative.   Neurological: Negative.   Psychiatric/Behavioral: Negative.        Physical Exam  BP 138/84    Pulse 75    SpO2 95%   Wt Readings from Last 5 Encounters:  12/05/20 235 lb (106.6 kg)  12/05/20 235 lb (106.6 kg)  11/01/20 237 lb 9.6 oz (107.8 kg)  10/03/20 236 lb 3.2 oz (107.1 kg)  12/06/19 241 lb 6.5 oz (109.5 kg)     Physical Exam Vitals and nursing note reviewed.  Constitutional:      General: She is not in acute distress.    Appearance: She is well-developed.  Cardiovascular:     Rate and Rhythm: Normal rate and regular rhythm.  Pulmonary:     Effort: Pulmonary effort is normal.     Breath sounds: Normal breath sounds.  Abdominal:     General: Bowel sounds are normal. There is no distension.     Palpations: Abdomen is soft. There is no mass.      Tenderness: There is abdominal tenderness in the left lower quadrant.  Neurological:     Mental Status: She is alert and oriented to person, place, and time.     Lab Results:  CBC    Component Value Date/Time   WBC 10.5 12/05/2020 1603   RBC 4.65 12/05/2020 1603   HGB 13.8 12/05/2020 1603   HGB 13.3 11/01/2020 1123   HCT 42.1 12/05/2020 1603   HCT 40.4 11/01/2020 1123   PLT 218 12/05/2020 1603   PLT 186 11/01/2020 1123   MCV 90.5 12/05/2020 1603   MCV 90 11/01/2020 1123   MCH 29.7 12/05/2020 1603   MCHC 32.8 12/05/2020 1603   RDW 15.9 (H) 12/05/2020 1603   RDW 15.7 (H) 11/01/2020 1123   LYMPHSABS 1.9 12/11/2019 0750   MONOABS 0.6 12/11/2019 0750   EOSABS 0.0 12/11/2019 0750   BASOSABS 0.0 12/11/2019 0750    BMET    Component Value Date/Time   NA 138 12/05/2020 1603   NA 140 11/01/2020 1123   K 4.4 12/05/2020 1603   CL 103 12/05/2020 1603   CO2 23 12/05/2020 1603   GLUCOSE 145 (H) 12/05/2020 1603   BUN 20 12/05/2020 1603   BUN 22 11/01/2020 1123   CREATININE 1.02 (H) 12/05/2020 1603   CALCIUM 10.1 12/05/2020 1603   GFRNONAA 55 (L) 12/05/2020 1603   GFRAA 48 (L) 12/11/2019 0750    BNP    Component Value Date/Time   BNP 40.2 12/09/2019 0239    ProBNP No results found for: PROBNP  Imaging: No results found.   Assessment & Plan:   Constipation Constipation:  Will order colace  Will order miralax  Stay as active as possible  May start probiotic  Follow up:  Follow up in 2 weeks with PCP     Fenton Foy, NP 08/10/2021

## 2021-08-10 ENCOUNTER — Encounter: Payer: Self-pay | Admitting: Nurse Practitioner

## 2021-08-10 DIAGNOSIS — K5900 Constipation, unspecified: Secondary | ICD-10-CM | POA: Insufficient documentation

## 2021-08-10 NOTE — Assessment & Plan Note (Signed)
Constipation:  Will order colace  Will order miralax  Stay as active as possible  May start probiotic  Follow up:  Follow up in 2 weeks with PCP
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
## 2021-08-29 ENCOUNTER — Ambulatory Visit (INDEPENDENT_AMBULATORY_CARE_PROVIDER_SITE_OTHER): Payer: Medicare Other | Admitting: Family

## 2021-08-29 ENCOUNTER — Other Ambulatory Visit: Payer: Self-pay

## 2021-08-29 ENCOUNTER — Encounter: Payer: Self-pay | Admitting: Family

## 2021-08-29 VITALS — BP 123/73 | HR 73 | Temp 98.3°F | Resp 18 | Ht 61.46 in | Wt 220.4 lb

## 2021-08-29 DIAGNOSIS — K5900 Constipation, unspecified: Secondary | ICD-10-CM

## 2021-08-29 DIAGNOSIS — Z23 Encounter for immunization: Secondary | ICD-10-CM | POA: Diagnosis not present

## 2021-08-29 DIAGNOSIS — R7303 Prediabetes: Secondary | ICD-10-CM

## 2021-08-29 NOTE — Progress Notes (Signed)
Pt presents for constipation and prediabetes, pt states constipation has cleared, and she is feeling much better and bowels are moving   Flu vaccine administered in right deltoid, pt tolerated injection well

## 2021-08-30 LAB — HEMOGLOBIN A1C
Est. average glucose Bld gHb Est-mCnc: 114 mg/dL
Hgb A1c MFr Bld: 5.6 % (ref 4.8–5.6)

## 2021-08-30 NOTE — Progress Notes (Signed)
Please call patient with update.   No pre-diabetes. No diabetes.

## 2021-09-04 ENCOUNTER — Other Ambulatory Visit: Payer: Self-pay | Admitting: Family

## 2021-09-04 DIAGNOSIS — Z1231 Encounter for screening mammogram for malignant neoplasm of breast: Secondary | ICD-10-CM

## 2021-09-05 ENCOUNTER — Telehealth: Payer: Self-pay | Admitting: Family

## 2021-09-05 NOTE — Telephone Encounter (Signed)
Returning nurse's call from Monday regarding results. Please call back when available thank you

## 2021-10-15 DIAGNOSIS — E785 Hyperlipidemia, unspecified: Secondary | ICD-10-CM | POA: Diagnosis not present

## 2021-10-15 DIAGNOSIS — K219 Gastro-esophageal reflux disease without esophagitis: Secondary | ICD-10-CM | POA: Diagnosis not present

## 2021-10-15 DIAGNOSIS — I25118 Atherosclerotic heart disease of native coronary artery with other forms of angina pectoris: Secondary | ICD-10-CM | POA: Diagnosis not present

## 2021-10-15 DIAGNOSIS — I10 Essential (primary) hypertension: Secondary | ICD-10-CM | POA: Diagnosis not present

## 2021-10-25 ENCOUNTER — Ambulatory Visit
Admission: RE | Admit: 2021-10-25 | Discharge: 2021-10-25 | Disposition: A | Discharge status: Home / Self Care | Payer: Medicare Other | Source: Ambulatory Visit | Attending: Family | Admitting: Family

## 2021-10-25 DIAGNOSIS — Z1231 Encounter for screening mammogram for malignant neoplasm of breast: Secondary | ICD-10-CM | POA: Diagnosis not present

## 2021-10-25 NOTE — Progress Notes (Signed)
Call patient with update.  ? ?Mammogram negative for malignancy. Repeat in 12 months.

## 2022-01-28 DIAGNOSIS — E785 Hyperlipidemia, unspecified: Secondary | ICD-10-CM | POA: Diagnosis not present

## 2022-01-28 DIAGNOSIS — I10 Essential (primary) hypertension: Secondary | ICD-10-CM | POA: Diagnosis not present

## 2022-01-28 DIAGNOSIS — I25118 Atherosclerotic heart disease of native coronary artery with other forms of angina pectoris: Secondary | ICD-10-CM | POA: Diagnosis not present

## 2022-01-28 DIAGNOSIS — N189 Chronic kidney disease, unspecified: Secondary | ICD-10-CM | POA: Diagnosis not present

## 2022-03-14 DIAGNOSIS — E785 Hyperlipidemia, unspecified: Secondary | ICD-10-CM | POA: Diagnosis not present

## 2022-03-14 DIAGNOSIS — M109 Gout, unspecified: Secondary | ICD-10-CM | POA: Diagnosis not present

## 2022-03-14 DIAGNOSIS — I10 Essential (primary) hypertension: Secondary | ICD-10-CM | POA: Diagnosis not present

## 2022-03-14 DIAGNOSIS — I2510 Atherosclerotic heart disease of native coronary artery without angina pectoris: Secondary | ICD-10-CM | POA: Diagnosis not present

## 2022-05-21 DIAGNOSIS — I25118 Atherosclerotic heart disease of native coronary artery with other forms of angina pectoris: Secondary | ICD-10-CM | POA: Diagnosis not present

## 2022-05-21 DIAGNOSIS — E785 Hyperlipidemia, unspecified: Secondary | ICD-10-CM | POA: Diagnosis not present

## 2022-05-21 DIAGNOSIS — N189 Chronic kidney disease, unspecified: Secondary | ICD-10-CM | POA: Diagnosis not present

## 2022-05-21 DIAGNOSIS — I10 Essential (primary) hypertension: Secondary | ICD-10-CM | POA: Diagnosis not present

## 2022-06-11 ENCOUNTER — Telehealth: Payer: Self-pay | Admitting: Family

## 2022-06-11 ENCOUNTER — Telehealth (INDEPENDENT_AMBULATORY_CARE_PROVIDER_SITE_OTHER): Payer: Self-pay

## 2022-06-11 NOTE — Telephone Encounter (Signed)
Copied from [place] [phone]. Topic: Medicare AWV >> Jun 11, 2022  8:52 AM Leilani Able wrote: Reason for CRM: Pt's daughter has called in to resch this AWV. Pls contact  but as daughter is a Marine scientist and has a schedule to contend with herself while taking care of mom. Pls fu but after 1:30 today as she works 3rd, will be up after 1:oo today. Call Janett Billow, daughter at [phone]. May leave a message for her to cb if she happens to miss call.

## 2022-06-11 NOTE — Telephone Encounter (Signed)
Att to contact pt to advise that appt for 10/23 has been cancelled and needs to be rescheduled no ans lvm  Please schedule next available appt for AWV/physical

## 2022-06-17 ENCOUNTER — Encounter: Payer: Medicare Other | Admitting: Family

## 2022-06-18 ENCOUNTER — Telehealth: Payer: Self-pay | Admitting: Family

## 2022-06-18 NOTE — Telephone Encounter (Signed)
Copied from [place] [phone]. Topic: Appointment Scheduling - Scheduling Inquiry for Clinic >> Jun 18, 2022  2:09 PM Tiffany B wrote: Reason for CRM: Caller would like to rsc patient AWV appointment  Rutherford Nail, do you want me to r/s?

## 2022-06-19 NOTE — Telephone Encounter (Signed)
Spoke w/patients daughter and got her r/s.

## 2022-06-19 NOTE — Telephone Encounter (Signed)
LM on pts VM to call Butch Penny back to r/s

## 2022-06-20 ENCOUNTER — Ambulatory Visit (INDEPENDENT_AMBULATORY_CARE_PROVIDER_SITE_OTHER): Payer: Medicare Other

## 2022-06-20 DIAGNOSIS — Z23 Encounter for immunization: Secondary | ICD-10-CM | POA: Diagnosis not present

## 2022-06-20 NOTE — Progress Notes (Signed)
Influenza vaccine-Sequiris High dose +65 administered in left deltoid, pt tolerated injection well

## 2022-07-04 DIAGNOSIS — H524 Presbyopia: Secondary | ICD-10-CM | POA: Diagnosis not present

## 2022-07-09 ENCOUNTER — Encounter: Payer: Self-pay | Admitting: Family

## 2022-07-09 ENCOUNTER — Ambulatory Visit (INDEPENDENT_AMBULATORY_CARE_PROVIDER_SITE_OTHER): Payer: Medicare Other | Admitting: Family

## 2022-07-09 VITALS — BP 120/81 | HR 76 | Temp 98.3°F | Resp 16 | Ht 61.46 in | Wt 232.0 lb

## 2022-07-09 DIAGNOSIS — Z13228 Encounter for screening for other metabolic disorders: Secondary | ICD-10-CM | POA: Diagnosis not present

## 2022-07-09 DIAGNOSIS — Z130 Encounter for screening for diseases of the blood and blood-forming organs and certain disorders involving the immune mechanism: Secondary | ICD-10-CM | POA: Diagnosis not present

## 2022-07-09 DIAGNOSIS — Z1329 Encounter for screening for other suspected endocrine disorder: Secondary | ICD-10-CM | POA: Diagnosis not present

## 2022-07-09 DIAGNOSIS — Z0000 Encounter for general adult medical examination without abnormal findings: Secondary | ICD-10-CM | POA: Diagnosis not present

## 2022-07-09 DIAGNOSIS — Z13820 Encounter for screening for osteoporosis: Secondary | ICD-10-CM | POA: Diagnosis not present

## 2022-07-09 DIAGNOSIS — Z1159 Encounter for screening for other viral diseases: Secondary | ICD-10-CM

## 2022-07-09 DIAGNOSIS — Z13220 Encounter for screening for lipoid disorders: Secondary | ICD-10-CM | POA: Diagnosis not present

## 2022-07-09 DIAGNOSIS — R7303 Prediabetes: Secondary | ICD-10-CM | POA: Diagnosis not present
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
## 2022-07-10 ENCOUNTER — Other Ambulatory Visit: Payer: Self-pay | Admitting: Family

## 2022-07-10 DIAGNOSIS — N184 Chronic kidney disease, stage 4 (severe): Secondary | ICD-10-CM

## 2022-07-10 LAB — CMP14+EGFR
ALT: 14 IU/L (ref 0–32)
AST: 18 IU/L (ref 0–40)
Albumin/Globulin Ratio: 1.2 (ref 1.2–2.2)
Albumin: 3.7 g/dL (ref 3.7–4.7)
Alkaline Phosphatase: 68 IU/L (ref 44–121)
BUN/Creatinine Ratio: 18 (ref 12–28)
BUN: 34 mg/dL — ABNORMAL HIGH (ref 8–27)
Bilirubin Total: 0.5 mg/dL (ref 0.0–1.2)
CO2: 23 mmol/L (ref 20–29)
Calcium: 10.8 mg/dL — ABNORMAL HIGH (ref 8.7–10.3)
Chloride: 102 mmol/L (ref 96–106)
Creatinine, Ser: 1.92 mg/dL — ABNORMAL HIGH (ref 0.57–1.00)
Globulin, Total: 3.2 g/dL (ref 1.5–4.5)
Glucose: 89 mg/dL (ref 70–99)
Potassium: 4.3 mmol/L (ref 3.5–5.2)
Sodium: 139 mmol/L (ref 134–144)
Total Protein: 6.9 g/dL (ref 6.0–8.5)
eGFR: 26 mL/min/{1.73_m2} — ABNORMAL LOW (ref >59)

## 2022-07-10 LAB — LIPID PANEL
Chol/HDL Ratio: 3 ratio (ref 0.0–4.4)
Cholesterol, Total: 174 mg/dL (ref 100–199)
HDL: 58 mg/dL (ref >39)
LDL Chol Calc (NIH): 98 mg/dL (ref 0–99)
Triglycerides: 98 mg/dL (ref 0–149)
VLDL Cholesterol Cal: 18 mg/dL (ref 5–40)

## 2022-07-10 LAB — HEMOGLOBIN A1C
Est. average glucose Bld gHb Est-mCnc: 120 mg/dL
Hgb A1c MFr Bld: 5.8 % — ABNORMAL HIGH (ref 4.8–5.6)

## 2022-07-10 LAB — CBC
Hematocrit: 40.2 % (ref 34.0–46.6)
Hemoglobin: 13.4 g/dL (ref 11.1–15.9)
MCH: 29.9 pg (ref 26.6–33.0)
MCHC: 33.3 g/dL (ref 31.5–35.7)
MCV: 90 fL (ref 79–97)
Platelets: 198 10*3/uL (ref 150–450)
RBC: 4.48 x10E6/uL (ref 3.77–5.28)
RDW: 13.7 % (ref 11.7–15.4)
WBC: 8 10*3/uL (ref 3.4–10.8)

## 2022-07-10 LAB — TSH: TSH: 1.53 u[IU]/mL (ref 0.450–4.500)

## 2022-07-10 LAB — HEPATITIS C ANTIBODY: Hep C Virus Ab: NONREACTIVE

## 2022-08-27 ENCOUNTER — Telehealth: Payer: Self-pay

## 2022-08-27 NOTE — Telephone Encounter (Signed)
Copied from [place] [id number]. Topic: Referral - Question >> Aug 23, 2022  9:11 AM Everette C wrote: Reason for CRM: The patient's daughter would like to speak with a member of clinical staff when possible regarding their recent referral to [facility]   Please contact the patient's daughter further when possible   Spoke w/pt daughter Vivien Rota) advised that due to mother kidney function lower than normal from last lab results provider referred pt to kidney specialist

## 2022-09-04 ENCOUNTER — Other Ambulatory Visit: Payer: Medicare Other

## 2022-09-05 LAB — CALCIUM: Calcium: 9.9 mg/dL (ref 8.7–10.3)

## 2022-09-17 DIAGNOSIS — N184 Chronic kidney disease, stage 4 (severe): Secondary | ICD-10-CM | POA: Diagnosis not present

## 2022-09-17 DIAGNOSIS — I129 Hypertensive chronic kidney disease with stage 1 through stage 4 chronic kidney disease, or unspecified chronic kidney disease: Secondary | ICD-10-CM | POA: Diagnosis not present

## 2022-09-17 DIAGNOSIS — N179 Acute kidney failure, unspecified: Secondary | ICD-10-CM | POA: Diagnosis not present

## 2022-09-17 DIAGNOSIS — K219 Gastro-esophageal reflux disease without esophagitis: Secondary | ICD-10-CM | POA: Diagnosis not present

## 2022-09-17 DIAGNOSIS — N1831 Chronic kidney disease, stage 3a: Secondary | ICD-10-CM | POA: Diagnosis not present

## 2022-09-17 DIAGNOSIS — M109 Gout, unspecified: Secondary | ICD-10-CM | POA: Diagnosis not present

## 2022-09-17 DIAGNOSIS — I2510 Atherosclerotic heart disease of native coronary artery without angina pectoris: Secondary | ICD-10-CM | POA: Diagnosis not present

## 2022-09-17 LAB — BASIC METABOLIC PANEL
BUN: 31 — AB (ref 4–21)
CO2: 26 — AB (ref 13–22)
Chloride: 107 (ref 99–108)
Creatinine: 1.1 (ref 0.5–1.1)
Glucose: 86
Potassium: 4.2 mEq/L (ref 3.5–5.1)
Sodium: 140 (ref 137–147)

## 2022-09-17 LAB — COMPREHENSIVE METABOLIC PANEL
Albumin: 3.7 (ref 3.5–5.0)
Calcium: 10 (ref 8.7–10.7)
Globulin: 2.6
eGFR: 53

## 2022-09-17 LAB — HEPATIC FUNCTION PANEL
ALT: 16 U/L (ref 7–35)
AST: 17 (ref 13–35)
Alkaline Phosphatase: 62 (ref 25–125)
Bilirubin, Total: 0.2

## 2022-09-17 LAB — PROTEIN / CREATININE RATIO, URINE
Albumin, U: 45.4
Creatinine, Urine: 218

## 2022-09-17 LAB — CBC AND DIFFERENTIAL
Hemoglobin: 1 — AB (ref 12.0–16.0)
Hemoglobin: 12.3 (ref 12.0–16.0)

## 2022-09-17 LAB — MICROALBUMIN, URINE: Microalb, Ur: 21.6

## 2022-09-17 LAB — MICROALBUMIN / CREATININE URINE RATIO: Microalb Creat Ratio: 21

## 2022-10-02 ENCOUNTER — Other Ambulatory Visit: Payer: Self-pay | Admitting: Family

## 2022-10-02 DIAGNOSIS — Z1231 Encounter for screening mammogram for malignant neoplasm of breast: Secondary | ICD-10-CM

## 2022-10-04 ENCOUNTER — Telehealth: Payer: Self-pay | Admitting: Physician Assistant

## 2022-10-04 NOTE — Telephone Encounter (Signed)
Scheduled appt per 2/9 referral. Pt is aware of appt date and time. Pt is aware to arrive 15 mins prior to appt time and to bring and updated insurance card. Pt is aware of appt location.

## 2022-10-23 ENCOUNTER — Inpatient Hospital Stay: Payer: Medicare Other

## 2022-10-23 ENCOUNTER — Inpatient Hospital Stay: Payer: Medicare Other | Attending: Physician Assistant | Admitting: Physician Assistant

## 2022-10-23 ENCOUNTER — Telehealth: Payer: Self-pay | Admitting: Physician Assistant

## 2022-10-23 ENCOUNTER — Encounter: Payer: Self-pay | Admitting: Physician Assistant

## 2022-10-23 VITALS — BP 157/75 | HR 85 | Temp 98.1°F | Resp 18 | Ht 61.46 in | Wt 235.7 lb

## 2022-10-23 DIAGNOSIS — Z809 Family history of malignant neoplasm, unspecified: Secondary | ICD-10-CM

## 2022-10-23 DIAGNOSIS — D472 Monoclonal gammopathy: Secondary | ICD-10-CM

## 2022-10-23 LAB — CBC WITH DIFFERENTIAL (CANCER CENTER ONLY)
Abs Immature Granulocytes: 0.02 10*3/uL (ref 0.00–0.07)
Basophils Absolute: 0.1 10*3/uL (ref 0.0–0.1)
Basophils Relative: 1 %
Eosinophils Absolute: 0.1 10*3/uL (ref 0.0–0.5)
Eosinophils Relative: 1 %
HCT: 38.3 % (ref 36.0–46.0)
Hemoglobin: 13 g/dL (ref 12.0–15.0)
Immature Granulocytes: 0 %
Lymphocytes Relative: 47 %
Lymphs Abs: 3.2 10*3/uL (ref 0.7–4.0)
MCH: 30.2 pg (ref 26.0–34.0)
MCHC: 33.9 g/dL (ref 30.0–36.0)
MCV: 89.1 fL (ref 80.0–100.0)
Monocytes Absolute: 0.6 10*3/uL (ref 0.1–1.0)
Monocytes Relative: 9 %
Neutro Abs: 2.9 10*3/uL (ref 1.7–7.7)
Neutrophils Relative %: 42 %
Platelet Count: 223 10*3/uL (ref 150–400)
RBC: 4.3 MIL/uL (ref 3.87–5.11)
RDW: 16 % — ABNORMAL HIGH (ref 11.5–15.5)
WBC Count: 7 10*3/uL (ref 4.0–10.5)
nRBC: 0 % (ref 0.0–0.2)

## 2022-10-23 LAB — CMP (CANCER CENTER ONLY)
ALT: 16 U/L (ref 0–44)
AST: 16 U/L (ref 15–41)
Albumin: 4 g/dL (ref 3.5–5.0)
Alkaline Phosphatase: 59 U/L (ref 38–126)
Anion gap: 6 (ref 5–15)
BUN: 30 mg/dL — ABNORMAL HIGH (ref 8–23)
CO2: 26 mmol/L (ref 22–32)
Calcium: 10 mg/dL (ref 8.9–10.3)
Chloride: 107 mmol/L (ref 98–111)
Creatinine: 1.14 mg/dL — ABNORMAL HIGH (ref 0.44–1.00)
GFR, Estimated: 48 mL/min — ABNORMAL LOW (ref >60)
Glucose, Bld: 83 mg/dL (ref 70–99)
Potassium: 4.4 mmol/L (ref 3.5–5.1)
Sodium: 139 mmol/L (ref 135–145)
Total Bilirubin: 0.4 mg/dL (ref 0.3–1.2)
Total Protein: 6.9 g/dL (ref 6.5–8.1)
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
## 2022-10-23 NOTE — Telephone Encounter (Signed)
Per 2/28 IB reached out to schedule patient, left voicemail.

## 2022-10-25 ENCOUNTER — Ambulatory Visit (HOSPITAL_COMMUNITY)
Admission: RE | Admit: 2022-10-25 | Discharge: 2022-10-25 | Disposition: A | Discharge status: Home / Self Care | Payer: Medicare Other | Source: Ambulatory Visit | Attending: Physician Assistant | Admitting: Physician Assistant

## 2022-10-25 ENCOUNTER — Other Ambulatory Visit: Payer: Self-pay | Admitting: *Deleted

## 2022-10-25 DIAGNOSIS — D472 Monoclonal gammopathy: Secondary | ICD-10-CM

## 2022-10-25 DIAGNOSIS — M170 Bilateral primary osteoarthritis of knee: Secondary | ICD-10-CM | POA: Diagnosis not present

## 2022-10-29 LAB — UPEP/UIFE/LIGHT CHAINS/TP, 24-HR UR
% BETA, Urine: 41.2 %
ALPHA 1 URINE: 4.7 %
Albumin, U: 27.7 %
Alpha 2, Urine: 11.2 %
Free Kappa Lt Chains,Ur: 31.51 mg/L (ref 1.17–86.46)
Free Kappa/Lambda Ratio: 9.61 (ref 1.83–14.26)
Free Lambda Lt Chains,Ur: 3.28 mg/L (ref 0.27–15.21)
GAMMA GLOBULIN URINE: 15.1 %
Total Protein, Urine-Ur/day: 144 mg/24 hr (ref 30–150)
Total Protein, Urine: 9.6 mg/dL
Total Volume: 1500

## 2022-10-29 LAB — MULTIPLE MYELOMA PANEL, SERUM
Albumin SerPl Elph-Mcnc: 3.9 g/dL (ref 2.9–4.4)
Albumin/Glob SerPl: 1.3 (ref 0.7–1.7)
Alpha 1: 0.2 g/dL (ref 0.0–0.4)
Alpha2 Glob SerPl Elph-Mcnc: 0.6 g/dL (ref 0.4–1.0)
B-Globulin SerPl Elph-Mcnc: 0.9 g/dL (ref 0.7–1.3)
Gamma Glob SerPl Elph-Mcnc: 1.5 g/dL (ref 0.4–1.8)
Globulin, Total: 3.2 g/dL (ref 2.2–3.9)
IgA: 228 mg/dL (ref 64–422)
IgG (Immunoglobin G), Serum: 1370 mg/dL (ref 586–1602)
IgM (Immunoglobulin M), Srm: 412 mg/dL — ABNORMAL HIGH (ref 26–217)
M Protein SerPl Elph-Mcnc: 0.2 g/dL — ABNORMAL HIGH
Total Protein ELP: 7.1 g/dL (ref 6.0–8.5)

## 2022-11-05 ENCOUNTER — Other Ambulatory Visit (HOSPITAL_COMMUNITY): Payer: Self-pay | Admitting: Student

## 2022-11-05 DIAGNOSIS — D472 Monoclonal gammopathy: Secondary | ICD-10-CM

## 2022-11-06 ENCOUNTER — Ambulatory Visit (HOSPITAL_COMMUNITY)
Admission: RE | Admit: 2022-11-06 | Discharge: 2022-11-06 | Disposition: A | Discharge status: Home / Self Care | Payer: Medicare Other | Source: Ambulatory Visit | Attending: Physician Assistant | Admitting: Physician Assistant

## 2022-11-06 ENCOUNTER — Other Ambulatory Visit: Payer: Self-pay

## 2022-11-06 ENCOUNTER — Encounter (HOSPITAL_COMMUNITY): Payer: Self-pay

## 2022-11-06 DIAGNOSIS — D6489 Other specified anemias: Secondary | ICD-10-CM | POA: Diagnosis not present

## 2022-11-06 DIAGNOSIS — D472 Monoclonal gammopathy: Secondary | ICD-10-CM

## 2022-11-06 DIAGNOSIS — Z1379 Encounter for other screening for genetic and chromosomal anomalies: Secondary | ICD-10-CM | POA: Insufficient documentation

## 2022-11-06 HISTORY — PX: IR BONE MARROW BIOPSY & ASPIRATION: IMG5727

## 2022-11-06 LAB — CBC WITH DIFFERENTIAL/PLATELET
Abs Immature Granulocytes: 0.02 10*3/uL (ref 0.00–0.07)
Basophils Absolute: 0 10*3/uL (ref 0.0–0.1)
Basophils Relative: 1 %
Eosinophils Absolute: 0.2 10*3/uL (ref 0.0–0.5)
Eosinophils Relative: 2 %
HCT: 34.2 % — ABNORMAL LOW (ref 36.0–46.0)
Hemoglobin: 11.3 g/dL — ABNORMAL LOW (ref 12.0–15.0)
Immature Granulocytes: 0 %
Lymphocytes Relative: 38 %
Lymphs Abs: 3.3 10*3/uL (ref 0.7–4.0)
MCH: 30.1 pg (ref 26.0–34.0)
MCHC: 33 g/dL (ref 30.0–36.0)
MCV: 91.2 fL (ref 80.0–100.0)
Monocytes Absolute: 0.9 10*3/uL (ref 0.1–1.0)
Monocytes Relative: 10 %
Neutro Abs: 4.3 10*3/uL (ref 1.7–7.7)
Neutrophils Relative %: 49 %
Platelets: 190 10*3/uL (ref 150–400)
RBC: 3.75 MIL/uL — ABNORMAL LOW (ref 3.87–5.11)
RDW: 16.6 % — ABNORMAL HIGH (ref 11.5–15.5)
WBC: 8.7 10*3/uL (ref 4.0–10.5)
nRBC: 0 % (ref 0.0–0.2)

## 2022-11-06 MED ORDER — SODIUM CHLORIDE 0.9 % IV SOLN: INTRAVENOUS | Status: DC

## 2022-11-06 MED ORDER — FENTANYL CITRATE (PF) 100 MCG/2ML IJ SOLN
INTRAMUSCULAR | Status: AC | PRN
  Administered 2022-11-06: 50 ug via INTRAVENOUS

## 2022-11-06 MED ORDER — FENTANYL CITRATE (PF) 100 MCG/2ML IJ SOLN
INTRAMUSCULAR | Status: AC
  Filled 2022-11-06: qty 2

## 2022-11-06 MED ORDER — MIDAZOLAM HCL 2 MG/2ML IJ SOLN
INTRAMUSCULAR | Status: AC | PRN
  Administered 2022-11-06: 1 mg via INTRAVENOUS

## 2022-11-06 MED ORDER — LIDOCAINE HCL (PF) 1 % IJ SOLN
INTRAMUSCULAR | Status: AC
  Administered 2022-11-06: 10 mL
  Filled 2022-11-06: qty 30

## 2022-11-06 MED ORDER — MIDAZOLAM HCL 2 MG/2ML IJ SOLN
INTRAMUSCULAR | Status: AC
  Filled 2022-11-06: qty 2

## 2022-11-06 NOTE — Sedation Documentation (Signed)
Sample #1 obtained 

## 2022-11-06 NOTE — Sedation Documentation (Signed)
Sample #2 obtained 

## 2022-11-06 NOTE — Discharge Instructions (Signed)
Discharge Instructions:   Please call Interventional Radiology clinic [phone] with any questions or concerns.  You may remove your dressing and shower tomorrow.  Moderate Conscious Sedation, Adult, Care After This sheet gives you information about how to care for yourself after your procedure. Your health care provider may also give you more specific instructions. If you have problems or questions, contact your health care provider. What can I expect after the procedure? After the procedure, it is common to have: Sleepiness for several hours. Impaired judgment for several hours. Difficulty with balance. Vomiting if you eat too soon. Follow these instructions at home: For the time period you were told by your health care provider: Rest. Do not participate in activities where you could fall or become injured. Do not drive or use machinery. Do not drink alcohol. Do not take sleeping pills or medicines that cause drowsiness. Do not make important decisions or sign legal documents. Do not take care of children on your own. Eating and drinking  Follow the diet recommended by your health care provider. Drink enough fluid to keep your urine pale yellow. If you vomit: Drink water, juice, or soup when you can drink without vomiting. Make sure you have little or no nausea before eating solid foods. General instructions Take over-the-counter and prescription medicines only as told by your health care provider. Have a responsible adult stay with you for the time you are told. It is important to have someone help care for you until you are awake and alert. Do not smoke. Keep all follow-up visits as told by your health care provider. This is important. Contact a health care provider if: You are still sleepy or having trouble with balance after 24 hours. You feel light-headed. You keep feeling nauseous or you keep vomiting. You develop a rash. You have a fever. You have redness or  swelling around the IV site. Get help right away if: You have trouble breathing. You have new-onset confusion at home. Summary After the procedure, it is common to feel sleepy, have impaired judgment, or feel nauseous if you eat too soon. Rest after you get home. Know the things you should not do after the procedure. Follow the diet recommended by your health care provider and drink enough fluid to keep your urine pale yellow. Get help right away if you have trouble breathing or new-onset confusion at home. This information is not intended to replace advice given to you by your health care provider. Make sure you discuss any questions you have with your health care provider. Document Revised: 12/10/2019 Document Reviewed: 07/08/2019 Elsevier Patient Education  [employer] Patient Education  2023 [employer].   Bone Marrow Aspiration and Bone Marrow Biopsy, Adult, Care After This sheet gives you information about how to care for yourself after your procedure. Your health care provider may also give you more specific instructions. If you have problems or questions, contact your health care provider. What can I expect after the procedure? After the procedure, it is common to have: Mild pain and tenderness. Swelling. Bruising. Follow these instructions at home: Puncture site care  Follow instructions from your health care provider about how to take care of the puncture site. Make sure you: Wash your hands with soap and water before and after you change your bandage (dressing). If soap and water are not available, use hand sanitizer. Change your dressing as told by your health care provider. Check your puncture site every day for signs of infection. Check  for: More redness, swelling, or pain. Fluid or blood. Warmth. Pus or a bad smell. Activity Return to your normal activities as told by your health care provider. Ask your health care provider what activities are safe  for you. Do not lift anything that is heavier than 10 lb (4.5 kg), or the limit that you are told, until your health care provider says that it is safe. Do not drive for 24 hours if you were given a sedative during your procedure. General instructions  Take over-the-counter and prescription medicines only as told by your health care provider. Do not take baths, swim, or use a hot tub until your health care provider approves. Ask your health care provider if you may take showers. You may only be allowed to take sponge baths. If directed, put ice on the affected area. To do this: Put ice in a plastic bag. Place a towel between your skin and the bag. Leave the ice on for 20 minutes, 2-3 times a day. Keep all follow-up visits as told by your health care provider. This is important. Contact a health care provider if: Your pain is not controlled with medicine. You have a fever. You have more redness, swelling, or pain around the puncture site. You have fluid or blood coming from the puncture site. Your puncture site feels warm to the touch. You have pus or a bad smell coming from the puncture site. Summary After the procedure, it is common to have mild pain, tenderness, swelling, and bruising. Follow instructions from your health care provider about how to take care of the puncture site and what activities are safe for you. Take over-the-counter and prescription medicines only as told by your health care provider. Contact a health care provider if you have any signs of infection, such as fluid or blood coming from the puncture site. This information is not intended to replace advice given to you by your health care provider. Make sure you discuss any questions you have with your health care provider. Document Revised: 12/29/2018 Document Reviewed: 12/29/2018 Elsevier Patient Education  [place].

## 2022-11-06 NOTE — Procedures (Signed)
Interventional Radiology Procedure Note  Procedure: FLUORO RT ILIAC BM ASP AND CORE    Complications: None  Estimated Blood Loss:  MIN  Findings: 11 G CORE AND ASP    M. TREVOR Viyan Rosamond, MD    
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
## 2022-11-07 DIAGNOSIS — D472 Monoclonal gammopathy: Secondary | ICD-10-CM | POA: Diagnosis not present

## 2022-11-08 LAB — SURGICAL PATHOLOGY

## 2022-11-11 ENCOUNTER — Telehealth: Payer: Self-pay | Admitting: Physician Assistant

## 2022-11-11 DIAGNOSIS — D472 Monoclonal gammopathy: Secondary | ICD-10-CM

## 2022-11-11 NOTE — Telephone Encounter (Signed)
I called patient's daughter, Evelena Peat, to review the lab results from 10/23/2022 and bone marrow biopsy results from 11/07/2022. 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 Pathology did send off for testing of MYD88 to r/o Waldenstroms as a possible cause.   Bone met survey ruled out lytic lesions. Lab was unable to drawn blood from serum free light chains so we will have patient return tomorrow to complete testing.   I will follow up with the patient once remaining workup is complete. [name] expressed understanding of the plan provided.

## 2022-11-12 ENCOUNTER — Encounter (HOSPITAL_COMMUNITY): Payer: Self-pay

## 2022-11-12 ENCOUNTER — Inpatient Hospital Stay: Payer: Medicare Other | Attending: Physician Assistant

## 2022-11-12 DIAGNOSIS — D472 Monoclonal gammopathy: Secondary | ICD-10-CM | POA: Diagnosis not present

## 2022-11-13 LAB — KAPPA/LAMBDA LIGHT CHAINS
Kappa free light chain: 48.3 mg/L — ABNORMAL HIGH (ref 3.3–19.4)
Kappa, lambda light chain ratio: 1.68 — ABNORMAL HIGH (ref 0.26–1.65)
Lambda free light chains: 28.7 mg/L — ABNORMAL HIGH (ref 5.7–26.3)

## 2022-11-18 ENCOUNTER — Telehealth: Payer: Self-pay | Admitting: Medical Oncology

## 2022-11-18 NOTE — Telephone Encounter (Signed)
She called re ? labs results.

## 2022-11-19 ENCOUNTER — Encounter (HOSPITAL_COMMUNITY): Payer: Self-pay | Admitting: Physician Assistant

## 2022-11-21 ENCOUNTER — Telehealth: Payer: Self-pay | Admitting: *Deleted

## 2022-11-21 NOTE — Telephone Encounter (Signed)
Received call - patient's daughter Shaune Pollack [phone] left voice mail She stated that she had missed a call from Dede Query, [place] who wanted to discuss her mother's test results. Ms. Marcelyn Ditty asked if Ms. Thayil could call her on Friday?  Message routed to Ms. Thayil

## 2022-11-29 ENCOUNTER — Encounter: Payer: Self-pay | Admitting: Family

## 2022-11-29 NOTE — Telephone Encounter (Signed)
Pt's daughter, Brayton Caves, advised with VU and September appt confirmed

## 2022-12-03 ENCOUNTER — Ambulatory Visit: Payer: Medicare Other

## 2022-12-31 ENCOUNTER — Inpatient Hospital Stay: Admission: RE | Admit: 2022-12-31 | Payer: Medicare Other | Source: Ambulatory Visit

## 2023-01-10 ENCOUNTER — Ambulatory Visit
Admission: RE | Admit: 2023-01-10 | Discharge: 2023-01-10 | Disposition: A | Discharge status: Home / Self Care | Payer: Medicare Other | Source: Ambulatory Visit | Attending: Family | Admitting: Family

## 2023-01-10 DIAGNOSIS — Z1231 Encounter for screening mammogram for malignant neoplasm of breast: Secondary | ICD-10-CM

## 2023-01-13 ENCOUNTER — Telehealth: Payer: Self-pay

## 2023-01-13 NOTE — Telephone Encounter (Signed)
Pt daughter who is on DPR was called and is aware of results, DOB was confirmed.

## 2023-01-13 NOTE — Telephone Encounter (Signed)
-----   Message from Rema Fendt, NP sent at 01/13/2023  3:37 PM EDT ----- No mammographic evidence of malignancy.

## 2023-01-14 ENCOUNTER — Ambulatory Visit: Payer: Self-pay | Admitting: *Deleted

## 2023-01-14 NOTE — Telephone Encounter (Signed)
Message from Randol Kern sent at 01/14/2023  9:09 AM EDT  Summary: cough, no appt   Pt's daughter called reporting that the patient has a cough, no fever. No appt available soon enough.  Best contact: [phone]          Call History   Type Contact Phone/Fax User  01/14/2023 09:08 AM EDT Phone (Incoming) [facility] (Emergency Contact) [phone] Maryellen Pile, [place]   Reason for Disposition  [1] Continuous (nonstop) coughing interferes with work or school AND [2] no improvement using cough treatment per Care Advice  Answer Assessment - Initial Assessment Questions 1. ONSET: "When did the cough begin?"      Dry cough with no fever.   Not feeling well.    I've given her some Robitussin for cough and cold. I returned call to daughter Flo Shanks.     Started a week ago. 2. SEVERITY: "How bad is the cough today?"      Just a dry cough 3. SPUTUM: "Describe the color of your sputum" (none, dry cough; clear, white, yellow, green)     Dry cough 4. HEMOPTYSIS: "Are you coughing up any blood?" If so ask: "How much?" (flecks, streaks, tablespoons, etc.)     Not asked 5. DIFFICULTY BREATHING: "Are you having difficulty breathing?" If Yes, ask: "How bad is it?" (e.g., mild, moderate, severe)    - MILD: No SOB at rest, mild SOB with walking, speaks normally in sentences, can lie down, no retractions, pulse < 100.    - MODERATE: SOB at rest, SOB with minimal exertion and prefers to sit, cannot lie down flat, speaks in phrases, mild retractions, audible wheezing, pulse 100-120.    - SEVERE: Very SOB at rest, speaks in single words, struggling to breathe, sitting hunched forward, retractions, pulse > 120      No shortness of breath or chest pain 6. FEVER: "Do you have a fever?" If Yes, ask: "What is your temperature, how was it measured, and when did it start?"     No Daughter wants her seen. 7. CARDIAC HISTORY: "Do you have any history of heart disease?" (e.g., heart attack, congestive  heart failure)      Hypertension 8. LUNG HISTORY: "Do you have any history of lung disease?"  (e.g., pulmonary embolus, asthma, emphysema)     No 9. PE RISK FACTORS: "Do you have a history of blood clots?" (or: recent major surgery, recent prolonged travel, bedridden)     Not asked 10. OTHER SYMPTOMS: "Do you have any other symptoms?" (e.g., runny nose, wheezing, chest pain)       No other symptoms  11. PREGNANCY: "Is there any chance you are pregnant?" "When was your last menstrual period?"       Not asked  12. TRAVEL: "Have you traveled out of the country in the last month?" (e.g., travel history, exposures)       Not asked  Protocols used: Cough - Acute Non-Productive-A-AH

## 2023-01-14 NOTE — Telephone Encounter (Signed)
  Chief Complaint: Dry cough for a week.   No fever.   Daughter, Brayton Caves (on [place]) wants to have her seen due to her age to be sure she does not have bronchitis. Symptoms: Dry cough for a week. Frequency: N/A Pertinent Negatives: Patient denies Fever or cough being productive, no other URI symptoms.   She doesn't feel good. Disposition: [] ED /[] Urgent Care (no appt availability in office) / [x] Appointment(In office/virtual)/ []  [place] [facility]/ [] Home Care/ [] Refused Recommended Disposition /[] [place] [facility]/ []  Follow-up with PCP Additional Notes: Appt. Made with Dr. Andrey Campanile for 01/15/2023 at 1:40.

## 2023-01-15 ENCOUNTER — Encounter: Payer: Self-pay | Admitting: Family Medicine

## 2023-01-15 ENCOUNTER — Ambulatory Visit (INDEPENDENT_AMBULATORY_CARE_PROVIDER_SITE_OTHER): Payer: Medicare Other | Admitting: Family Medicine

## 2023-01-15 VITALS — BP 100/57 | HR 89 | Temp 98.2°F | Resp 16 | Wt 231.4 lb

## 2023-01-15 DIAGNOSIS — F509 Eating disorder, unspecified: Secondary | ICD-10-CM | POA: Diagnosis not present

## 2023-01-15 DIAGNOSIS — J209 Acute bronchitis, unspecified: Secondary | ICD-10-CM | POA: Diagnosis not present

## 2023-01-15 MED ORDER — AZITHROMYCIN 250 MG PO TABS: ORAL_TABLET | ORAL | 0 refills | Status: AC

## 2023-01-15 MED ORDER — MEGESTROL ACETATE 20 MG PO TABS: 20.0000 mg | ORAL_TABLET | Freq: Two times a day (BID) | ORAL | 1 refills | Status: AC
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
## 2023-01-21 ENCOUNTER — Encounter: Payer: Self-pay | Admitting: Family Medicine

## 2023-01-22 ENCOUNTER — Other Ambulatory Visit: Payer: Self-pay | Admitting: *Deleted

## 2023-02-06 DIAGNOSIS — N182 Chronic kidney disease, stage 2 (mild): Secondary | ICD-10-CM | POA: Diagnosis not present

## 2023-02-06 DIAGNOSIS — E782 Mixed hyperlipidemia: Secondary | ICD-10-CM | POA: Diagnosis not present

## 2023-02-06 DIAGNOSIS — I25118 Atherosclerotic heart disease of native coronary artery with other forms of angina pectoris: Secondary | ICD-10-CM | POA: Diagnosis not present

## 2023-02-06 DIAGNOSIS — I10 Essential (primary) hypertension: Secondary | ICD-10-CM | POA: Diagnosis not present

## 2023-02-11 DIAGNOSIS — E785 Hyperlipidemia, unspecified: Secondary | ICD-10-CM | POA: Diagnosis not present

## 2023-02-11 DIAGNOSIS — I10 Essential (primary) hypertension: Secondary | ICD-10-CM | POA: Diagnosis not present

## 2023-02-11 DIAGNOSIS — M109 Gout, unspecified: Secondary | ICD-10-CM | POA: Diagnosis not present
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
## 2023-02-12 ENCOUNTER — Encounter: Payer: Self-pay | Admitting: Family

## 2023-02-12 ENCOUNTER — Ambulatory Visit (INDEPENDENT_AMBULATORY_CARE_PROVIDER_SITE_OTHER): Payer: Medicare Other | Admitting: Family

## 2023-02-12 VITALS — BP 106/76 | HR 77 | Temp 98.4°F | Ht 60.0 in | Wt 234.8 lb

## 2023-02-12 DIAGNOSIS — R7303 Prediabetes: Secondary | ICD-10-CM | POA: Diagnosis not present

## 2023-02-12 LAB — POCT GLYCOSYLATED HEMOGLOBIN (HGB A1C): HbA1c, POC (controlled diabetic range): 5.6 % (ref 0.0–7.0)

## 2023-05-07 DIAGNOSIS — R778 Other specified abnormalities of plasma proteins: Secondary | ICD-10-CM | POA: Diagnosis not present

## 2023-05-07 DIAGNOSIS — M109 Gout, unspecified: Secondary | ICD-10-CM | POA: Diagnosis not present

## 2023-05-07 DIAGNOSIS — I129 Hypertensive chronic kidney disease with stage 1 through stage 4 chronic kidney disease, or unspecified chronic kidney disease: Secondary | ICD-10-CM | POA: Diagnosis not present

## 2023-05-07 DIAGNOSIS — N1831 Chronic kidney disease, stage 3a: Secondary | ICD-10-CM | POA: Diagnosis not present

## 2023-05-07 DIAGNOSIS — R600 Localized edema: Secondary | ICD-10-CM | POA: Diagnosis not present

## 2023-05-13 ENCOUNTER — Ambulatory Visit: Payer: Medicare Other | Admitting: Physician Assistant

## 2023-05-13 ENCOUNTER — Other Ambulatory Visit: Payer: Medicare Other

## 2023-05-20 ENCOUNTER — Other Ambulatory Visit: Payer: Self-pay | Admitting: Physician Assistant

## 2023-05-20 DIAGNOSIS — D472 Monoclonal gammopathy: Secondary | ICD-10-CM

## 2023-05-21 ENCOUNTER — Inpatient Hospital Stay: Payer: Medicare Other | Attending: Physician Assistant

## 2023-05-21 ENCOUNTER — Inpatient Hospital Stay (HOSPITAL_BASED_OUTPATIENT_CLINIC_OR_DEPARTMENT_OTHER): Payer: Medicare Other | Admitting: Physician Assistant

## 2023-05-21 VITALS — BP 101/87 | HR 84 | Temp 98.1°F | Resp 16 | Wt 243.8 lb

## 2023-05-21 DIAGNOSIS — D472 Monoclonal gammopathy: Secondary | ICD-10-CM | POA: Diagnosis not present

## 2023-05-21 DIAGNOSIS — Z803 Family history of malignant neoplasm of breast: Secondary | ICD-10-CM | POA: Insufficient documentation

## 2023-05-21 LAB — CMP (CANCER CENTER ONLY)
ALT: 19 U/L (ref 0–44)
AST: 20 U/L (ref 15–41)
Albumin: 3.9 g/dL (ref 3.5–5.0)
Alkaline Phosphatase: 59 U/L (ref 38–126)
Anion gap: 7 (ref 5–15)
BUN: 34 mg/dL — ABNORMAL HIGH (ref 8–23)
CO2: 27 mmol/L (ref 22–32)
Calcium: 10.4 mg/dL — ABNORMAL HIGH (ref 8.9–10.3)
Chloride: 109 mmol/L (ref 98–111)
Creatinine: 1.28 mg/dL — ABNORMAL HIGH (ref 0.44–1.00)
GFR, Estimated: 42 mL/min — ABNORMAL LOW (ref >60)
Glucose, Bld: 90 mg/dL (ref 70–99)
Potassium: 4 mmol/L (ref 3.5–5.1)
Sodium: 143 mmol/L (ref 135–145)
Total Bilirubin: 0.4 mg/dL (ref 0.3–1.2)
Total Protein: 7.4 g/dL (ref 6.5–8.1)

## 2023-05-21 LAB — CBC WITH DIFFERENTIAL (CANCER CENTER ONLY)
Abs Immature Granulocytes: 0.01 10*3/uL (ref 0.00–0.07)
Basophils Absolute: 0.1 10*3/uL (ref 0.0–0.1)
Basophils Relative: 1 %
Eosinophils Absolute: 0.1 10*3/uL (ref 0.0–0.5)
Eosinophils Relative: 2 %
HCT: 36.8 % (ref 36.0–46.0)
Hemoglobin: 11.9 g/dL — ABNORMAL LOW (ref 12.0–15.0)
Immature Granulocytes: 0 %
Lymphocytes Relative: 43 %
Lymphs Abs: 2.7 10*3/uL (ref 0.7–4.0)
MCH: 30.1 pg (ref 26.0–34.0)
MCHC: 32.3 g/dL (ref 30.0–36.0)
MCV: 92.9 fL (ref 80.0–100.0)
Monocytes Absolute: 0.6 10*3/uL (ref 0.1–1.0)
Monocytes Relative: 9 %
Neutro Abs: 2.8 10*3/uL (ref 1.7–7.7)
Neutrophils Relative %: 45 %
Platelet Count: 214 10*3/uL (ref 150–400)
RBC: 3.96 MIL/uL (ref 3.87–5.11)
RDW: 16 % — ABNORMAL HIGH (ref 11.5–15.5)
WBC Count: 6.3 10*3/uL (ref 4.0–10.5)
nRBC: 0 % (ref 0.0–0.2)
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
## 2023-05-23 LAB — KAPPA/LAMBDA LIGHT CHAINS
Kappa free light chain: 33.3 mg/L — ABNORMAL HIGH (ref 3.3–19.4)
Kappa, lambda light chain ratio: 1.14 (ref 0.26–1.65)
Lambda free light chains: 29.3 mg/L — ABNORMAL HIGH (ref 5.7–26.3)

## 2023-05-25 LAB — MULTIPLE MYELOMA PANEL, SERUM
Albumin SerPl Elph-Mcnc: 3.6 g/dL (ref 2.9–4.4)
Albumin/Glob SerPl: 1.1 (ref 0.7–1.7)
Alpha 1: 0.3 g/dL (ref 0.0–0.4)
Alpha2 Glob SerPl Elph-Mcnc: 0.7 g/dL (ref 0.4–1.0)
B-Globulin SerPl Elph-Mcnc: 1 g/dL (ref 0.7–1.3)
Gamma Glob SerPl Elph-Mcnc: 1.5 g/dL (ref 0.4–1.8)
Globulin, Total: 3.4 g/dL (ref 2.2–3.9)
IgA: 214 mg/dL (ref 64–422)
IgG (Immunoglobin G), Serum: 1362 mg/dL (ref 586–1602)
IgM (Immunoglobulin M), Srm: 383 mg/dL — ABNORMAL HIGH (ref 26–217)
M Protein SerPl Elph-Mcnc: 0.4 g/dL — ABNORMAL HIGH
Total Protein ELP: 7 g/dL (ref 6.0–8.5)

## 2023-05-26 ENCOUNTER — Telehealth: Payer: Self-pay

## 2023-05-26 NOTE — Telephone Encounter (Signed)
Pt's daughter ,Brayton Caves, advised of lab results and recommendations

## 2023-05-26 NOTE — Telephone Encounter (Signed)
-----   Message from Briant Cedar sent at 05/26/2023  9:43 AM EDT ----- Please notify patient that the M protein levels are overall stable to mildly increased. No intervention is required. Continue with monitor labs and return in 6 months as scheduled. ----- Message ----- From: Leory Plowman, Lab [facility] [place] Sent: 05/21/2023  10:12 AM EDT To: Briant Cedar, PA-C

## 2023-05-29 ENCOUNTER — Ambulatory Visit: Payer: Medicare Other

## 2023-05-29 DIAGNOSIS — Z0000 Encounter for general adult medical examination without abnormal findings: Secondary | ICD-10-CM | POA: Diagnosis not present

## 2023-05-29 NOTE — Patient Instructions (Signed)
Ms. Takayama , Thank you for taking time to come for your Medicare Wellness Visit. I appreciate your ongoing commitment to your health goals. Please review the following plan we discussed and let me know if I can assist you in the future.   Referrals/Orders/Follow-Ups/Clinician Recommendations: none  This is a list of the screening recommended for you and due dates:  Health Maintenance  Topic Date Due   Flu Shot  03/27/2023   COVID-19 Vaccine (4 - 2023-24 season) 04/27/2023   Zoster (Shingles) Vaccine (1 of 2) 08/26/2023*   Medicare Annual Wellness Visit  05/28/2024   DTaP/Tdap/Td vaccine (2 - Td or Tdap) 11/02/2030   Pneumonia Vaccine  Completed   DEXA scan (bone density measurement)  Completed   HPV Vaccine  Aged Out  *Topic was postponed. The date shown is not the original due date.    Advanced directives: (Declined) Advance directive discussed with you today. Even though you declined this today, please call our office should you change your mind, and we can give you the proper paperwork for you to fill out.  Next Medicare Annual Wellness Visit scheduled for next year: No, schedule not open for next year  Insert Preventive Care attachment Insert FALL PREVENTION attachment if needed
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
## 2023-06-04 DIAGNOSIS — M109 Gout, unspecified: Secondary | ICD-10-CM | POA: Diagnosis not present

## 2023-06-04 DIAGNOSIS — I129 Hypertensive chronic kidney disease with stage 1 through stage 4 chronic kidney disease, or unspecified chronic kidney disease: Secondary | ICD-10-CM | POA: Diagnosis not present

## 2023-06-04 DIAGNOSIS — R778 Other specified abnormalities of plasma proteins: Secondary | ICD-10-CM | POA: Diagnosis not present

## 2023-06-04 DIAGNOSIS — N1831 Chronic kidney disease, stage 3a: Secondary | ICD-10-CM | POA: Diagnosis not present

## 2023-06-04 DIAGNOSIS — R600 Localized edema: Secondary | ICD-10-CM | POA: Diagnosis not present

## 2023-06-11 ENCOUNTER — Telehealth: Payer: Self-pay | Admitting: Family

## 2023-06-16 ENCOUNTER — Other Ambulatory Visit: Payer: Self-pay | Admitting: Family

## 2023-06-16 DIAGNOSIS — Z13820 Encounter for screening for osteoporosis: Secondary | ICD-10-CM

## 2023-06-16 DIAGNOSIS — Z1231 Encounter for screening mammogram for malignant neoplasm of breast: Secondary | ICD-10-CM

## 2023-06-17 DIAGNOSIS — R778 Other specified abnormalities of plasma proteins: Secondary | ICD-10-CM | POA: Diagnosis not present

## 2023-06-17 DIAGNOSIS — I129 Hypertensive chronic kidney disease with stage 1 through stage 4 chronic kidney disease, or unspecified chronic kidney disease: Secondary | ICD-10-CM | POA: Diagnosis not present

## 2023-06-17 DIAGNOSIS — M109 Gout, unspecified: Secondary | ICD-10-CM | POA: Diagnosis not present

## 2023-06-17 DIAGNOSIS — R600 Localized edema: Secondary | ICD-10-CM | POA: Diagnosis not present

## 2023-06-17 DIAGNOSIS — N1831 Chronic kidney disease, stage 3a: Secondary | ICD-10-CM | POA: Diagnosis not present

## 2023-06-26 ENCOUNTER — Telehealth: Payer: Self-pay | Admitting: Family

## 2023-06-30 DIAGNOSIS — R7989 Other specified abnormal findings of blood chemistry: Secondary | ICD-10-CM | POA: Diagnosis not present

## 2023-07-14 ENCOUNTER — Encounter: Payer: Self-pay | Admitting: Family

## 2023-07-14 ENCOUNTER — Ambulatory Visit (INDEPENDENT_AMBULATORY_CARE_PROVIDER_SITE_OTHER): Payer: Medicare Other | Admitting: Family

## 2023-07-14 VITALS — BP 126/85 | HR 76 | Temp 98.1°F | Ht 60.0 in | Wt 245.2 lb

## 2023-07-14 DIAGNOSIS — Z0000 Encounter for general adult medical examination without abnormal findings: Secondary | ICD-10-CM | POA: Diagnosis not present

## 2023-07-14 DIAGNOSIS — R7303 Prediabetes: Secondary | ICD-10-CM | POA: Diagnosis not present

## 2023-07-14 DIAGNOSIS — Z1329 Encounter for screening for other suspected endocrine disorder: Secondary | ICD-10-CM

## 2023-07-14 NOTE — Progress Notes (Signed)
Patient states no concerns to discuss
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
## 2023-07-15 LAB — TSH: TSH: 2.09 u[IU]/mL (ref 0.450–4.500)

## 2023-07-15 LAB — HEMOGLOBIN A1C
Est. average glucose Bld gHb Est-mCnc: 120 mg/dL
Hgb A1c MFr Bld: 5.8 % — ABNORMAL HIGH (ref 4.8–5.6)

## 2023-08-15 ENCOUNTER — Ambulatory Visit: Payer: Medicare Other | Admitting: Family

## 2023-08-18 ENCOUNTER — Ambulatory Visit: Payer: Medicare Other | Admitting: Family

## 2023-09-04 DIAGNOSIS — E559 Vitamin D deficiency, unspecified: Secondary | ICD-10-CM | POA: Diagnosis not present

## 2023-10-27 ENCOUNTER — Ambulatory Visit (HOSPITAL_COMMUNITY)
Admission: RE | Admit: 2023-10-27 | Discharge: 2023-10-27 | Disposition: A | Discharge status: Home / Self Care | Payer: Medicare Other | Source: Ambulatory Visit | Attending: Physician Assistant | Admitting: Physician Assistant

## 2023-10-27 DIAGNOSIS — D472 Monoclonal gammopathy: Secondary | ICD-10-CM | POA: Insufficient documentation

## 2023-10-27 DIAGNOSIS — M8580 Other specified disorders of bone density and structure, unspecified site: Secondary | ICD-10-CM | POA: Diagnosis not present

## 2023-10-27 DIAGNOSIS — I517 Cardiomegaly: Secondary | ICD-10-CM | POA: Diagnosis not present

## 2023-11-05 ENCOUNTER — Inpatient Hospital Stay: Payer: Medicare Other | Attending: Internal Medicine

## 2023-11-05 ENCOUNTER — Other Ambulatory Visit: Payer: Self-pay

## 2023-11-05 DIAGNOSIS — Z803 Family history of malignant neoplasm of breast: Secondary | ICD-10-CM | POA: Diagnosis not present

## 2023-11-05 DIAGNOSIS — D472 Monoclonal gammopathy: Secondary | ICD-10-CM

## 2023-11-05 LAB — CBC WITH DIFFERENTIAL (CANCER CENTER ONLY)
Abs Immature Granulocytes: 0.01 10*3/uL (ref 0.00–0.07)
Basophils Absolute: 0 10*3/uL (ref 0.0–0.1)
Basophils Relative: 0 %
Eosinophils Absolute: 0.1 10*3/uL (ref 0.0–0.5)
Eosinophils Relative: 2 %
HCT: 39.2 % (ref 36.0–46.0)
Hemoglobin: 13 g/dL (ref 12.0–15.0)
Immature Granulocytes: 0 %
Lymphocytes Relative: 49 %
Lymphs Abs: 2.9 10*3/uL (ref 0.7–4.0)
MCH: 30.4 pg (ref 26.0–34.0)
MCHC: 33.2 g/dL (ref 30.0–36.0)
MCV: 91.8 fL (ref 80.0–100.0)
Monocytes Absolute: 0.5 10*3/uL (ref 0.1–1.0)
Monocytes Relative: 8 %
Neutro Abs: 2.5 10*3/uL (ref 1.7–7.7)
Neutrophils Relative %: 41 %
Platelet Count: 193 10*3/uL (ref 150–400)
RBC: 4.27 MIL/uL (ref 3.87–5.11)
RDW: 15.5 % (ref 11.5–15.5)
WBC Count: 6 10*3/uL (ref 4.0–10.5)
nRBC: 0 % (ref 0.0–0.2)

## 2023-11-05 LAB — CMP (CANCER CENTER ONLY)
ALT: 15 U/L (ref 0–44)
AST: 17 U/L (ref 15–41)
Albumin: 4 g/dL (ref 3.5–5.0)
Alkaline Phosphatase: 58 U/L (ref 38–126)
Anion gap: 7 (ref 5–15)
BUN: 29 mg/dL — ABNORMAL HIGH (ref 8–23)
CO2: 25 mmol/L (ref 22–32)
Calcium: 10.2 mg/dL (ref 8.9–10.3)
Chloride: 109 mmol/L (ref 98–111)
Creatinine: 1.24 mg/dL — ABNORMAL HIGH (ref 0.44–1.00)
GFR, Estimated: 43 mL/min — ABNORMAL LOW (ref >60)
Glucose, Bld: 103 mg/dL — ABNORMAL HIGH (ref 70–99)
Potassium: 3.9 mmol/L (ref 3.5–5.1)
Sodium: 141 mmol/L (ref 135–145)
Total Bilirubin: 0.5 mg/dL (ref 0.0–1.2)
Total Protein: 7.4 g/dL (ref 6.5–8.1)

## 2023-11-06 LAB — KAPPA/LAMBDA LIGHT CHAINS
Kappa free light chain: 36.8 mg/L — ABNORMAL HIGH (ref 3.3–19.4)
Kappa, lambda light chain ratio: 1.26 (ref 0.26–1.65)
Lambda free light chains: 29.1 mg/L — ABNORMAL HIGH (ref 5.7–26.3)

## 2023-11-09 LAB — MULTIPLE MYELOMA PANEL, SERUM
Albumin SerPl Elph-Mcnc: 3.5 g/dL (ref 2.9–4.4)
Albumin/Glob SerPl: 1.1 (ref 0.7–1.7)
Alpha 1: 0.2 g/dL (ref 0.0–0.4)
Alpha2 Glob SerPl Elph-Mcnc: 0.7 g/dL (ref 0.4–1.0)
B-Globulin SerPl Elph-Mcnc: 1 g/dL (ref 0.7–1.3)
Gamma Glob SerPl Elph-Mcnc: 1.5 g/dL (ref 0.4–1.8)
Globulin, Total: 3.5 g/dL (ref 2.2–3.9)
IgA: 226 mg/dL (ref 64–422)
IgG (Immunoglobin G), Serum: 1461 mg/dL (ref 586–1602)
IgM (Immunoglobulin M), Srm: 367 mg/dL — ABNORMAL HIGH (ref 26–217)
M Protein SerPl Elph-Mcnc: 0.5 g/dL — ABNORMAL HIGH
Total Protein ELP: 7 g/dL (ref 6.0–8.5)

## 2023-11-10 DIAGNOSIS — N1831 Chronic kidney disease, stage 3a: Secondary | ICD-10-CM | POA: Diagnosis not present

## 2023-11-17 DIAGNOSIS — I129 Hypertensive chronic kidney disease with stage 1 through stage 4 chronic kidney disease, or unspecified chronic kidney disease: Secondary | ICD-10-CM | POA: Diagnosis not present

## 2023-11-17 DIAGNOSIS — R600 Localized edema: Secondary | ICD-10-CM | POA: Diagnosis not present

## 2023-11-17 DIAGNOSIS — N1831 Chronic kidney disease, stage 3a: Secondary | ICD-10-CM | POA: Diagnosis not present

## 2023-11-17 DIAGNOSIS — E213 Hyperparathyroidism, unspecified: Secondary | ICD-10-CM | POA: Diagnosis not present

## 2023-11-18 ENCOUNTER — Inpatient Hospital Stay (HOSPITAL_BASED_OUTPATIENT_CLINIC_OR_DEPARTMENT_OTHER): Payer: Medicare Other | Admitting: Physician Assistant

## 2023-11-18 VITALS — BP 109/52 | HR 69 | Temp 97.7°F | Resp 18 | Ht 60.0 in | Wt 242.6 lb

## 2023-11-18 DIAGNOSIS — D472 Monoclonal gammopathy: Secondary | ICD-10-CM | POA: Diagnosis not present

## 2023-11-18 DIAGNOSIS — Z803 Family history of malignant neoplasm of breast: Secondary | ICD-10-CM | POA: Diagnosis not present
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
## 2024-01-01 ENCOUNTER — Inpatient Hospital Stay: Attending: Physician Assistant

## 2024-01-01 ENCOUNTER — Other Ambulatory Visit: Payer: Self-pay | Admitting: *Deleted

## 2024-01-01 DIAGNOSIS — D472 Monoclonal gammopathy: Secondary | ICD-10-CM | POA: Diagnosis not present

## 2024-01-09 LAB — UPEP/UIFE/LIGHT CHAINS/TP, 24-HR UR
% BETA, Urine: 32.7 %
ALPHA 1 URINE: 4.8 %
Albumin, U: 26.4 %
Alpha 2, Urine: 18.3 %
Free Kappa Lt Chains,Ur: 41.52 mg/L (ref 1.17–86.46)
Free Kappa/Lambda Ratio: 6.94 (ref 1.83–14.26)
Free Lambda Lt Chains,Ur: 5.98 mg/L (ref 0.27–15.21)
GAMMA GLOBULIN URINE: 17.7 %
Total Protein, Urine-Ur/day: 48 mg/(24.h) (ref 30–150)
Total Protein, Urine: 6.9 mg/dL
Total Volume: 700

## 2024-01-12 ENCOUNTER — Ambulatory Visit: Payer: Self-pay | Admitting: Physician Assistant

## 2024-01-12 ENCOUNTER — Ambulatory Visit: Payer: Self-pay | Admitting: Family

## 2024-01-12 ENCOUNTER — Encounter: Payer: Self-pay | Admitting: Family

## 2024-01-12 ENCOUNTER — Ambulatory Visit (INDEPENDENT_AMBULATORY_CARE_PROVIDER_SITE_OTHER): Payer: Medicare Other | Admitting: Family

## 2024-01-12 VITALS — BP 125/87 | HR 81 | Wt 236.2 lb

## 2024-01-12 DIAGNOSIS — R7303 Prediabetes: Secondary | ICD-10-CM

## 2024-01-12 LAB — POCT GLYCOSYLATED HEMOGLOBIN (HGB A1C): HbA1c, POC (controlled diabetic range): 5.7 % (ref 0.0–7.0)
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
## 2024-01-21 ENCOUNTER — Ambulatory Visit: Payer: Medicare Other

## 2024-01-21 ENCOUNTER — Ambulatory Visit
Admission: RE | Admit: 2024-01-21 | Discharge: 2024-01-21 | Disposition: A | Discharge status: Home / Self Care | Payer: Medicare Other | Source: Ambulatory Visit | Attending: Family | Admitting: Family

## 2024-01-21 ENCOUNTER — Other Ambulatory Visit: Payer: Medicare Other

## 2024-01-21 DIAGNOSIS — Z1231 Encounter for screening mammogram for malignant neoplasm of breast: Secondary | ICD-10-CM

## 2024-01-21 DIAGNOSIS — M8588 Other specified disorders of bone density and structure, other site: Secondary | ICD-10-CM | POA: Diagnosis not present

## 2024-01-21 DIAGNOSIS — Z13820 Encounter for screening for osteoporosis: Secondary | ICD-10-CM

## 2024-01-23 ENCOUNTER — Ambulatory Visit: Payer: Self-pay | Admitting: Family

## 2024-01-26 ENCOUNTER — Ambulatory Visit: Payer: Self-pay | Admitting: Family

## 2024-01-26 DIAGNOSIS — M8580 Other specified disorders of bone density and structure, unspecified site: Secondary | ICD-10-CM
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
## 2024-02-03 ENCOUNTER — Encounter: Admitting: Physician Assistant

## 2024-02-03 DIAGNOSIS — R7989 Other specified abnormal findings of blood chemistry: Secondary | ICD-10-CM

## 2024-02-03 DIAGNOSIS — Z8781 Personal history of (healed) traumatic fracture: Secondary | ICD-10-CM

## 2024-02-03 DIAGNOSIS — Z8719 Personal history of other diseases of the digestive system: Secondary | ICD-10-CM

## 2024-02-03 DIAGNOSIS — I249 Acute ischemic heart disease, unspecified: Secondary | ICD-10-CM

## 2024-02-03 DIAGNOSIS — I10 Essential (primary) hypertension: Secondary | ICD-10-CM

## 2024-02-03 DIAGNOSIS — M85851 Other specified disorders of bone density and structure, right thigh: Secondary | ICD-10-CM

## 2024-03-17 DIAGNOSIS — E559 Vitamin D deficiency, unspecified: Secondary | ICD-10-CM | POA: Diagnosis not present

## 2024-03-18 DIAGNOSIS — E559 Vitamin D deficiency, unspecified: Secondary | ICD-10-CM | POA: Diagnosis not present

## 2024-04-30 ENCOUNTER — Telehealth: Payer: Self-pay | Admitting: Hematology and Oncology

## 2024-05-11 ENCOUNTER — Other Ambulatory Visit: Payer: Self-pay | Admitting: Physician Assistant

## 2024-05-11 ENCOUNTER — Other Ambulatory Visit: Payer: Self-pay | Admitting: Hematology and Oncology

## 2024-05-11 DIAGNOSIS — D472 Monoclonal gammopathy: Secondary | ICD-10-CM

## 2024-05-12 ENCOUNTER — Inpatient Hospital Stay: Attending: Physician Assistant

## 2024-05-12 DIAGNOSIS — D472 Monoclonal gammopathy: Secondary | ICD-10-CM | POA: Insufficient documentation

## 2024-05-12 DIAGNOSIS — Z90710 Acquired absence of both cervix and uterus: Secondary | ICD-10-CM | POA: Insufficient documentation

## 2024-05-12 DIAGNOSIS — Z8349 Family history of other endocrine, nutritional and metabolic diseases: Secondary | ICD-10-CM | POA: Diagnosis not present

## 2024-05-12 DIAGNOSIS — Z814 Family history of other substance abuse and dependence: Secondary | ICD-10-CM | POA: Diagnosis not present

## 2024-05-12 DIAGNOSIS — Z9049 Acquired absence of other specified parts of digestive tract: Secondary | ICD-10-CM | POA: Insufficient documentation

## 2024-05-12 DIAGNOSIS — Z79899 Other long term (current) drug therapy: Secondary | ICD-10-CM | POA: Diagnosis not present

## 2024-05-12 DIAGNOSIS — Z8249 Family history of ischemic heart disease and other diseases of the circulatory system: Secondary | ICD-10-CM | POA: Diagnosis not present

## 2024-05-12 DIAGNOSIS — Z841 Family history of disorders of kidney and ureter: Secondary | ICD-10-CM | POA: Diagnosis not present

## 2024-05-12 DIAGNOSIS — M19012 Primary osteoarthritis, left shoulder: Secondary | ICD-10-CM | POA: Insufficient documentation

## 2024-05-12 DIAGNOSIS — I10 Essential (primary) hypertension: Secondary | ICD-10-CM | POA: Insufficient documentation

## 2024-05-12 DIAGNOSIS — M109 Gout, unspecified: Secondary | ICD-10-CM | POA: Insufficient documentation

## 2024-05-12 DIAGNOSIS — Z803 Family history of malignant neoplasm of breast: Secondary | ICD-10-CM | POA: Insufficient documentation

## 2024-05-12 DIAGNOSIS — M19011 Primary osteoarthritis, right shoulder: Secondary | ICD-10-CM | POA: Diagnosis not present

## 2024-05-12 DIAGNOSIS — Z8261 Family history of arthritis: Secondary | ICD-10-CM | POA: Diagnosis not present

## 2024-05-12 DIAGNOSIS — Z833 Family history of diabetes mellitus: Secondary | ICD-10-CM | POA: Insufficient documentation

## 2024-05-12 DIAGNOSIS — Z7982 Long term (current) use of aspirin: Secondary | ICD-10-CM | POA: Insufficient documentation

## 2024-05-12 DIAGNOSIS — C7951 Secondary malignant neoplasm of bone: Secondary | ICD-10-CM | POA: Diagnosis not present

## 2024-05-12 DIAGNOSIS — D649 Anemia, unspecified: Secondary | ICD-10-CM | POA: Diagnosis not present

## 2024-05-12 LAB — CBC WITH DIFFERENTIAL (CANCER CENTER ONLY)
Abs Immature Granulocytes: 0.02 K/uL (ref 0.00–0.07)
Basophils Absolute: 0.1 K/uL (ref 0.0–0.1)
Basophils Relative: 1 %
Eosinophils Absolute: 0.2 K/uL (ref 0.0–0.5)
Eosinophils Relative: 2 %
HCT: 36.3 % (ref 36.0–46.0)
Hemoglobin: 11.8 g/dL — ABNORMAL LOW (ref 12.0–15.0)
Immature Granulocytes: 0 %
Lymphocytes Relative: 44 %
Lymphs Abs: 3.2 K/uL (ref 0.7–4.0)
MCH: 29.6 pg (ref 26.0–34.0)
MCHC: 32.5 g/dL (ref 30.0–36.0)
MCV: 91.2 fL (ref 80.0–100.0)
Monocytes Absolute: 0.6 K/uL (ref 0.1–1.0)
Monocytes Relative: 8 %
Neutro Abs: 3.4 K/uL (ref 1.7–7.7)
Neutrophils Relative %: 45 %
Platelet Count: 197 K/uL (ref 150–400)
RBC: 3.98 MIL/uL (ref 3.87–5.11)
RDW: 14.7 % (ref 11.5–15.5)
WBC Count: 7.4 K/uL (ref 4.0–10.5)
nRBC: 0 % (ref 0.0–0.2)

## 2024-05-12 LAB — CMP (CANCER CENTER ONLY)
ALT: 16 U/L (ref 0–44)
AST: 17 U/L (ref 15–41)
Albumin: 3.9 g/dL (ref 3.5–5.0)
Alkaline Phosphatase: 61 U/L (ref 38–126)
Anion gap: 7 (ref 5–15)
BUN: 34 mg/dL — ABNORMAL HIGH (ref 8–23)
CO2: 28 mmol/L (ref 22–32)
Calcium: 10.7 mg/dL — ABNORMAL HIGH (ref 8.9–10.3)
Chloride: 107 mmol/L (ref 98–111)
Creatinine: 1.56 mg/dL — ABNORMAL HIGH (ref 0.44–1.00)
GFR, Estimated: 33 mL/min — ABNORMAL LOW (ref >60)
Glucose, Bld: 86 mg/dL (ref 70–99)
Potassium: 3.8 mmol/L (ref 3.5–5.1)
Sodium: 142 mmol/L (ref 135–145)
Total Bilirubin: 0.3 mg/dL (ref 0.0–1.2)
Total Protein: 7.4 g/dL (ref 6.5–8.1)

## 2024-05-12 LAB — LACTATE DEHYDROGENASE: LDH: 182 U/L (ref 98–192)

## 2024-05-13 LAB — KAPPA/LAMBDA LIGHT CHAINS
Kappa free light chain: 39.6 mg/L — ABNORMAL HIGH (ref 3.3–19.4)
Kappa, lambda light chain ratio: 1.26 (ref 0.26–1.65)
Lambda free light chains: 31.4 mg/L — ABNORMAL HIGH (ref 5.7–26.3)

## 2024-05-15 LAB — MULTIPLE MYELOMA PANEL, SERUM
Albumin SerPl Elph-Mcnc: 3.4 g/dL (ref 2.9–4.4)
Albumin/Glob SerPl: 1 (ref 0.7–1.7)
Alpha 1: 0.2 g/dL (ref 0.0–0.4)
Alpha2 Glob SerPl Elph-Mcnc: 0.8 g/dL (ref 0.4–1.0)
B-Globulin SerPl Elph-Mcnc: 1 g/dL (ref 0.7–1.3)
Gamma Glob SerPl Elph-Mcnc: 1.5 g/dL (ref 0.4–1.8)
Globulin, Total: 3.6 g/dL (ref 2.2–3.9)
IgA: 218 mg/dL (ref 64–422)
IgG (Immunoglobin G), Serum: 1340 mg/dL (ref 586–1602)
IgM (Immunoglobulin M), Srm: 339 mg/dL — ABNORMAL HIGH (ref 26–217)
M Protein SerPl Elph-Mcnc: 0.4 g/dL — ABNORMAL HIGH
Total Protein ELP: 7 g/dL (ref 6.0–8.5)
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
## 2024-05-18 ENCOUNTER — Ambulatory Visit (HOSPITAL_COMMUNITY)
Admission: RE | Admit: 2024-05-18 | Discharge: 2024-05-18 | Disposition: A | Discharge status: Home / Self Care | Source: Ambulatory Visit | Attending: Physician Assistant

## 2024-05-18 ENCOUNTER — Inpatient Hospital Stay (HOSPITAL_BASED_OUTPATIENT_CLINIC_OR_DEPARTMENT_OTHER): Admitting: Physician Assistant

## 2024-05-18 VITALS — BP 128/82 | HR 75 | Temp 97.3°F | Resp 16 | Wt 234.3 lb

## 2024-05-18 DIAGNOSIS — Z841 Family history of disorders of kidney and ureter: Secondary | ICD-10-CM | POA: Diagnosis not present

## 2024-05-18 DIAGNOSIS — Z79899 Other long term (current) drug therapy: Secondary | ICD-10-CM | POA: Diagnosis not present

## 2024-05-18 DIAGNOSIS — Z9049 Acquired absence of other specified parts of digestive tract: Secondary | ICD-10-CM | POA: Diagnosis not present

## 2024-05-18 DIAGNOSIS — M25512 Pain in left shoulder: Secondary | ICD-10-CM

## 2024-05-18 DIAGNOSIS — M109 Gout, unspecified: Secondary | ICD-10-CM | POA: Diagnosis not present

## 2024-05-18 DIAGNOSIS — Z7982 Long term (current) use of aspirin: Secondary | ICD-10-CM | POA: Diagnosis not present

## 2024-05-18 DIAGNOSIS — M25511 Pain in right shoulder: Secondary | ICD-10-CM

## 2024-05-18 DIAGNOSIS — Z833 Family history of diabetes mellitus: Secondary | ICD-10-CM | POA: Diagnosis not present

## 2024-05-18 DIAGNOSIS — D472 Monoclonal gammopathy: Secondary | ICD-10-CM

## 2024-05-18 DIAGNOSIS — M25712 Osteophyte, left shoulder: Secondary | ICD-10-CM | POA: Diagnosis not present

## 2024-05-18 DIAGNOSIS — C7951 Secondary malignant neoplasm of bone: Secondary | ICD-10-CM | POA: Diagnosis not present

## 2024-05-18 DIAGNOSIS — I10 Essential (primary) hypertension: Secondary | ICD-10-CM | POA: Diagnosis not present

## 2024-05-18 DIAGNOSIS — M19011 Primary osteoarthritis, right shoulder: Secondary | ICD-10-CM | POA: Diagnosis not present

## 2024-05-18 DIAGNOSIS — M778 Other enthesopathies, not elsewhere classified: Secondary | ICD-10-CM | POA: Diagnosis not present

## 2024-05-18 DIAGNOSIS — Z8249 Family history of ischemic heart disease and other diseases of the circulatory system: Secondary | ICD-10-CM | POA: Diagnosis not present

## 2024-05-18 DIAGNOSIS — Z8349 Family history of other endocrine, nutritional and metabolic diseases: Secondary | ICD-10-CM | POA: Diagnosis not present

## 2024-05-18 DIAGNOSIS — Z803 Family history of malignant neoplasm of breast: Secondary | ICD-10-CM | POA: Diagnosis not present

## 2024-05-18 DIAGNOSIS — D649 Anemia, unspecified: Secondary | ICD-10-CM | POA: Diagnosis not present

## 2024-05-18 DIAGNOSIS — Z8261 Family history of arthritis: Secondary | ICD-10-CM | POA: Diagnosis not present

## 2024-05-18 DIAGNOSIS — M19012 Primary osteoarthritis, left shoulder: Secondary | ICD-10-CM | POA: Diagnosis not present

## 2024-05-20 ENCOUNTER — Emergency Department (HOSPITAL_BASED_OUTPATIENT_CLINIC_OR_DEPARTMENT_OTHER)

## 2024-05-20 ENCOUNTER — Emergency Department (HOSPITAL_BASED_OUTPATIENT_CLINIC_OR_DEPARTMENT_OTHER)
Admission: EM | Admit: 2024-05-20 | Discharge: 2024-05-21 | Disposition: A | Discharge status: Home / Self Care | Attending: Emergency Medicine | Admitting: Emergency Medicine

## 2024-05-20 ENCOUNTER — Other Ambulatory Visit: Payer: Self-pay

## 2024-05-20 DIAGNOSIS — I10 Essential (primary) hypertension: Secondary | ICD-10-CM | POA: Diagnosis not present

## 2024-05-20 DIAGNOSIS — Z7982 Long term (current) use of aspirin: Secondary | ICD-10-CM | POA: Diagnosis not present

## 2024-05-20 DIAGNOSIS — Z79899 Other long term (current) drug therapy: Secondary | ICD-10-CM | POA: Insufficient documentation

## 2024-05-20 DIAGNOSIS — K8020 Calculus of gallbladder without cholecystitis without obstruction: Secondary | ICD-10-CM | POA: Diagnosis not present

## 2024-05-20 DIAGNOSIS — K429 Umbilical hernia without obstruction or gangrene: Secondary | ICD-10-CM | POA: Diagnosis not present

## 2024-05-20 DIAGNOSIS — R1012 Left upper quadrant pain: Secondary | ICD-10-CM | POA: Insufficient documentation

## 2024-05-20 DIAGNOSIS — R109 Unspecified abdominal pain: Secondary | ICD-10-CM | POA: Diagnosis not present

## 2024-05-20 DIAGNOSIS — K5730 Diverticulosis of large intestine without perforation or abscess without bleeding: Secondary | ICD-10-CM | POA: Diagnosis not present

## 2024-05-20 LAB — CBC
HCT: 37.7 % (ref 36.0–46.0)
Hemoglobin: 12.1 g/dL (ref 12.0–15.0)
MCH: 30.3 pg (ref 26.0–34.0)
MCHC: 32.1 g/dL (ref 30.0–36.0)
MCV: 94.3 fL (ref 80.0–100.0)
Platelets: 212 K/uL (ref 150–400)
RBC: 4 MIL/uL (ref 3.87–5.11)
RDW: 15.3 % (ref 11.5–15.5)
WBC: 7.3 K/uL (ref 4.0–10.5)
nRBC: 0 % (ref 0.0–0.2)

## 2024-05-20 LAB — LIPASE, BLOOD: Lipase: 38 U/L (ref 11–51)

## 2024-05-20 LAB — URINALYSIS, ROUTINE W REFLEX MICROSCOPIC
Bilirubin Urine: NEGATIVE
Glucose, UA: NEGATIVE mg/dL
Hgb urine dipstick: NEGATIVE
Ketones, ur: NEGATIVE mg/dL
Leukocytes,Ua: NEGATIVE
Nitrite: NEGATIVE
Protein, ur: NEGATIVE mg/dL
Specific Gravity, Urine: 1.018 (ref 1.005–1.030)
pH: 5 (ref 5.0–8.0)

## 2024-05-20 LAB — COMPREHENSIVE METABOLIC PANEL WITH GFR
ALT: 16 U/L (ref 0–44)
AST: 28 U/L (ref 15–41)
Albumin: 4.1 g/dL (ref 3.5–5.0)
Alkaline Phosphatase: 70 U/L (ref 38–126)
Anion gap: 13 (ref 5–15)
BUN: 20 mg/dL (ref 8–23)
CO2: 25 mmol/L (ref 22–32)
Calcium: 11 mg/dL — ABNORMAL HIGH (ref 8.9–10.3)
Chloride: 102 mmol/L (ref 98–111)
Creatinine, Ser: 1.34 mg/dL — ABNORMAL HIGH (ref 0.44–1.00)
GFR, Estimated: 39 mL/min — ABNORMAL LOW (ref >60)
Glucose, Bld: 94 mg/dL (ref 70–99)
Potassium: 4 mmol/L (ref 3.5–5.1)
Sodium: 140 mmol/L (ref 135–145)
Total Bilirubin: 0.5 mg/dL (ref 0.0–1.2)
Total Protein: 7.4 g/dL (ref 6.5–8.1)

## 2024-05-20 MED ORDER — IOHEXOL 300 MG/ML  SOLN
80.0000 mL | Freq: Once | INTRAMUSCULAR | Status: AC | PRN
  Administered 2024-05-20: 80 mL via INTRAVENOUS

## 2024-05-20 NOTE — ED Triage Notes (Signed)
 Pt POV reporting L side abd pain x1 week, feels like pins and needles. Normal bowel and urinary patterns, no n/v/d, afebrile.

## 2024-05-20 NOTE — ED Provider Notes (Signed)
 [place] EMERGENCY DEPARTMENT AT [facility] Provider Note   CSN: [id number] Arrival date & time: 05/20/24  1944     Patient presents with: Abdominal Pain   Shelby Meza is a 83 y.o. female.  {Add pertinent medical, surgical, social history, OB history to HPI:32947} Patient is an 83 year old female with past medical history of hypertension and GERD.  Patient presenting with complaints of abdominal pain.  She describes pain to the left upper quadrant that has been present for the past month.  The pain seems to come and go throughout the day and not associated with eating or drinking.  She denies any bowel or bladder complaints.  No fevers or chills.  No alleviating factors.  She has had a hysterectomy in the past, but denies other abdominal surgeries.       Prior to Admission medications   Medication Sig Start Date End Date Taking? Authorizing Provider  albuterol  (VENTOLIN  HFA) 108 (90 Base) MCG/ACT inhaler Inhale 2 puffs into the lungs every 6 (six) hours as needed for wheezing or shortness of breath. 12/11/19   Ghimire, Donalda HERO, MD  allopurinol  (ZYLOPRIM ) 300 MG tablet Take 300 mg by mouth daily.    [provider]  amLODipine  (NORVASC ) 10 MG tablet Take 1 tablet (10 mg total) by mouth daily. 12/11/19   Ghimire, Donalda HERO, MD  aspirin  81 MG chewable tablet Chew 81 mg by mouth daily.    [provider]  atorvastatin  (LIPITOR ) 20 MG tablet Take 1 tablet (20 mg total) by mouth daily at 6 PM. 12/25/15   Levern Hutching, MD  Cholecalciferol (VITAMIN D3) 1000 units CAPS Take by mouth.    [provider]  cyclobenzaprine  (FLEXERIL ) 10 MG tablet Take 10 mg by mouth every 8 (eight) hours as needed for muscle spasms.     [provider]  docusate sodium  (COLACE) 100 MG capsule Take 1 capsule (100 mg total) by mouth 2 (two) times daily. 08/09/21   Oley Bascom RAMAN, NP  furosemide  (LASIX ) 40 MG tablet Take 40 mg by mouth daily. 05/19/23   [provider]  losartan  (COZAAR ) 50 MG tablet Take 50 mg by mouth daily. 09/08/20   [provider]  megestrol  (MEGACE ) 20 MG tablet Take 1 tablet (20 mg total) by mouth 2 (two) times daily. 01/15/23   Tanda Bleacher, MD  Menthol , Topical Analgesic, (BENGAY EX) Apply 1 application topically as needed (for pain).    [provider]  Multiple Vitamins-Minerals (MULTIVITAMIN PO) Take 1 tablet by mouth daily.    [provider]  nitroGLYCERIN  (NITROSTAT ) 0.3 MG SL tablet     [provider]  Omega-3 Fatty Acids (FISH OIL) 1000 MG CAPS     [provider]  polyethylene glycol powder (GLYCOLAX /MIRALAX ) 17 GM/SCOOP powder Take 17 g by mouth 2 (two) times daily as needed. 08/09/21   Oley Bascom RAMAN, NP  sertraline  (ZOLOFT ) 50 MG tablet Take 50 mg by mouth daily.    [provider]  traMADol  (ULTRAM ) 50 MG tablet Take 50 mg by mouth every 8 (eight) hours as needed for moderate pain.    [provider]  vitamin B-12 (CYANOCOBALAMIN ) 1000 MCG tablet Take 1,000 mcg by mouth daily.    [provider]    Allergies: Chocolate    Review of Systems  All other systems reviewed and are negative.   Updated Vital Signs BP 122/71   Pulse 74   Temp 98.7 F (37.1 C)   Resp  17   Ht 5' 5 (1.651 m)   Wt 106.1 kg   SpO2 94%   BMI 38.94 kg/m   Physical Exam Vitals and nursing note reviewed.  Constitutional:      General: She is not in acute distress.    Appearance: She is well-developed. She is not diaphoretic.  HENT:     Head: Normocephalic and atraumatic.  Cardiovascular:     Rate and Rhythm: Normal rate and regular rhythm.     Heart sounds: No murmur heard.    No friction rub. No gallop.  Pulmonary:     Effort: Pulmonary effort is normal. No respiratory distress.     Breath sounds: Normal breath sounds. No wheezing.  Abdominal:     General: Bowel sounds are normal. There is no distension.     Palpations: Abdomen is soft.      Tenderness: There is abdominal tenderness in the left upper quadrant. There is no right CVA tenderness, left CVA tenderness, guarding or rebound.  Musculoskeletal:        General: Normal range of motion.     Cervical back: Normal range of motion and neck supple.  Skin:    General: Skin is warm and dry.  Neurological:     General: No focal deficit present.     Mental Status: She is alert and oriented to person, place, and time.     (all labs ordered are listed, but only abnormal results are displayed) Labs Reviewed  COMPREHENSIVE METABOLIC PANEL WITH GFR - Abnormal; Notable for the following components:      Result Value   Creatinine, Ser 1.34 (*)    Calcium  11.0 (*)    GFR, Estimated 39 (*)    All other components within normal limits  LIPASE, BLOOD  CBC  URINALYSIS, ROUTINE W REFLEX MICROSCOPIC    EKG: None  Radiology: No results found.  {Document cardiac monitor, telemetry assessment procedure when appropriate:32947} Procedures   Medications Ordered in the ED  iohexol  (OMNIPAQUE ) 300 MG/ML solution 80 mL (has no administration in time range)      {Click here for ABCD2, HEART and other calculators REFRESH Note before signing:1}                              Medical Decision Making Amount and/or Complexity of Data Reviewed Labs: ordered.   ***  {Document critical care time when appropriate  Document review of labs and clinical decision tools ie CHADS2VASC2, etc  Document your independent review of radiology images and any outside records  Document your discussion with family members, caretakers and with consultants  Document social determinants of health affecting pt's care  Document your decision making why or why not admission, treatments were needed:32947:::1}   Final diagnoses:  None    ED Discharge Orders     None

## 2024-05-21 NOTE — Discharge Instructions (Signed)
 Return to the emergency department if you develop worsening pain, high fevers, bloody stool or vomit, or for other new and concerning symptoms.  Follow-up with your primary doctor if not improving in the next few days.

## 2024-05-24 ENCOUNTER — Ambulatory Visit: Payer: Self-pay

## 2024-05-24 NOTE — Telephone Encounter (Signed)
LM with results and recommendations

## 2024-05-24 NOTE — Telephone Encounter (Signed)
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 Advised to follow up with PCP or ortho to further evaluate.   Thanks, Johnston ----- Message ----- From: Rebecka, Rad Results In Sent: 05/23/2024   7:16 PM EDT To: Johnston ONEIDA Police, PA-C

## 2024-05-26 ENCOUNTER — Ambulatory Visit: Admitting: Physician Assistant

## 2024-05-26 ENCOUNTER — Other Ambulatory Visit

## 2024-05-28 DIAGNOSIS — M109 Gout, unspecified: Secondary | ICD-10-CM | POA: Diagnosis not present

## 2024-05-28 DIAGNOSIS — E213 Hyperparathyroidism, unspecified: Secondary | ICD-10-CM | POA: Diagnosis not present

## 2024-05-28 DIAGNOSIS — I10 Essential (primary) hypertension: Secondary | ICD-10-CM | POA: Diagnosis not present

## 2024-05-28 DIAGNOSIS — R600 Localized edema: Secondary | ICD-10-CM | POA: Diagnosis not present

## 2024-05-28 DIAGNOSIS — I129 Hypertensive chronic kidney disease with stage 1 through stage 4 chronic kidney disease, or unspecified chronic kidney disease: Secondary | ICD-10-CM | POA: Diagnosis not present

## 2024-05-28 DIAGNOSIS — N1831 Chronic kidney disease, stage 3a: Secondary | ICD-10-CM | POA: Diagnosis not present

## 2024-05-29 LAB — LAB REPORT - SCANNED: PTH, Intact: 191

## 2024-05-31 DIAGNOSIS — E559 Vitamin D deficiency, unspecified: Secondary | ICD-10-CM | POA: Diagnosis not present

## 2024-06-07 DIAGNOSIS — N1831 Chronic kidney disease, stage 3a: Secondary | ICD-10-CM | POA: Diagnosis not present

## 2024-06-22 ENCOUNTER — Ambulatory Visit

## 2024-06-22 VITALS — Ht 60.0 in | Wt 234.0 lb

## 2024-06-22 DIAGNOSIS — Z0000 Encounter for general adult medical examination without abnormal findings: Secondary | ICD-10-CM | POA: Diagnosis not present

## 2024-06-22 NOTE — Patient Instructions (Addendum)
 Shelby Meza,  Thank you for taking the time for your Medicare Wellness Visit. I appreciate your continued commitment to your health goals. Please review the care plan we discussed, and feel free to reach out if I can assist you further.  Medicare recommends these wellness visits once per year to help you and your care team stay ahead of potential health issues. These visits are designed to focus on prevention, allowing your provider to concentrate on managing your acute and chronic conditions during your regular appointments.  Please note that Annual Wellness Visits do not include a physical exam. Some assessments may be limited, especially if the visit was conducted virtually. If needed, we may recommend a separate in-person follow-up with your provider.  Ongoing Care Seeing your primary care provider every 3 to 6 months helps us  monitor your health and provide consistent, personalized care. Next office visit on 07/14/2024.  You are due for a flu vaccina and a Shingrix vaccine they both can be given to you at your local pharmacy.    Referrals If a referral was made during today's visit and you haven't received any updates within two weeks, please contact the referred provider directly to check on the status.  Recommended Screenings:  Health Maintenance  Topic Date Due   Zoster (Shingles) Vaccine (1 of 2) Never done   Flu Shot  03/26/2024   COVID-19 Vaccine (4 - 2025-26 season) 04/26/2024   Medicare Annual Wellness Visit  05/28/2024   DTaP/Tdap/Td vaccine (2 - Td or Tdap) 11/02/2030   Pneumococcal Vaccine for age over 77  Completed   DEXA scan (bone density measurement)  Completed   Meningitis B Vaccine  Aged Out       06/22/2024    9:00 AM  Advanced Directives  Does Patient Have a Medical Advance Directive? Yes  Type of Advance Directive Healthcare Power of Attorney  Copy of Healthcare Power of Attorney in Chart? No - copy requested   Advance Care Planning is important because  it: Ensures you receive medical care that aligns with your values, goals, and preferences. Provides guidance to your family and loved ones, reducing the emotional burden of decision-making during critical moments.  Vision: Annual vision screenings are recommended for early detection of glaucoma, cataracts, and diabetic retinopathy. These exams can also reveal signs of chronic conditions such as diabetes and high blood pressure.  Dental: Annual dental screenings help detect early signs of oral cancer, gum disease, and other conditions linked to overall health, including heart disease and diabetes.  Please see the attached documents for additional preventive care recommendations.
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
## 2024-07-14 ENCOUNTER — Ambulatory Visit: Admitting: Family

## 2024-08-16 ENCOUNTER — Ambulatory Visit
Admission: EM | Admit: 2024-08-16 | Discharge: 2024-08-16 | Disposition: A | Discharge status: Home / Self Care | Attending: Emergency Medicine | Admitting: Emergency Medicine

## 2024-08-16 ENCOUNTER — Ambulatory Visit: Admit: 2024-08-16 | Discharge: 2024-08-16 | Discharge status: Absent without leave

## 2024-08-16 ENCOUNTER — Ambulatory Visit: Admitting: Radiology

## 2024-08-16 ENCOUNTER — Other Ambulatory Visit: Payer: Self-pay

## 2024-08-16 ENCOUNTER — Ambulatory Visit: Payer: Self-pay | Admitting: Emergency Medicine

## 2024-08-16 DIAGNOSIS — J209 Acute bronchitis, unspecified: Secondary | ICD-10-CM | POA: Diagnosis not present

## 2024-08-16 DIAGNOSIS — R051 Acute cough: Secondary | ICD-10-CM

## 2024-08-16 LAB — POC COVID19/FLU A&B COMBO
Covid Antigen, POC: NEGATIVE
Influenza A Antigen, POC: NEGATIVE
Influenza B Antigen, POC: NEGATIVE

## 2024-08-16 MED ORDER — AZITHROMYCIN 250 MG PO TABS: 250.0000 mg | ORAL_TABLET | Freq: Every day | ORAL | 0 refills | Status: AC

## 2024-08-16 MED ORDER — BENZONATATE 100 MG PO CAPS: 100.0000 mg | ORAL_CAPSULE | Freq: Three times a day (TID) | ORAL | 0 refills | Status: AC

## 2024-08-16 MED ORDER — PREDNISONE 10 MG PO TABS: 30.0000 mg | ORAL_TABLET | Freq: Every day | ORAL | 0 refills | Status: AC

## 2024-08-16 NOTE — Discharge Instructions (Addendum)
 I did not see any pneumonia on your chest x-ray, official read will be back over the next few hours and I will call you if we need to change her treatment plan, results will also be available in MyChart  I am suspicious of bronchitis.  Take the azithromycin  as prescribed and with food to treat any bacterial infection.  Start the prednisone  today and then take it daily with breakfast to help with inflammation in the lungs.  Use the Tessalon  Perles as needed for cough suppression, you can take this every 8 hours.  Ensure you are staying well-hydrated and getting plenty of rest.  Symptoms should improve with medication.  If no improvement or any changes seek follow-up care.
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
## 2024-08-16 NOTE — ED Triage Notes (Signed)
 Pt presents with c/o productive cough and sore throat for approximately one week. No fevers. Has been taking OTC Nyquil for symptoms with no improvement. Also endorses loss of appetite + nasal congestion. States she is feeling pain but this is due to chronic arthritis.

## 2024-09-14 ENCOUNTER — Ambulatory Visit: Payer: Self-pay | Admitting: *Deleted

## 2024-09-14 NOTE — Telephone Encounter (Signed)
 FYI Only or Action Required?: FYI only for provider: appointment scheduled on 1/21.  Patient was last seen in primary care on 01/12/2024 by [name], NP.  Called Nurse Triage reporting Cyst.  Symptoms began several weeks ago.  Interventions attempted: Nothing.  Symptoms are: unchanged.  Triage Disposition: See Physician Within 24 Hours  Patient/caregiver understands and will follow disposition?: Yes   Message from [facility] sent at 09/14/2024  1:36 PM EST  Reason for Triage: Knot in right shoulder.. pain ( worsening ) .Shelby Meza   Reason for Disposition  [1] Swelling is painful to touch AND [2] no fever  Answer Assessment - Initial Assessment Questions Patient's daughter is calling to schedule an appointment for her mother- [place]- upon return she has a painful knot on her R shoulder.   1. APPEARANCE of SWELLING: What does it look like?     Top of shoulder- R 2. SIZE: How large is the swelling? (e.g., inches, cm; or compare to size of pinhead, tip of pen, eraser, coin, pea, grape, ping pong ball)      Slightly smaller than golfball 3. LOCATION: Where is the swelling located?     Shoulder- R 4. ONSET: When did the swelling start?     1 month 5. COLOR: What color is it? Is there more than one color?     Flesh colored 6. PAIN: Is there any pain? If Yes, ask: How bad is the pain? (Scale 1-10; or mild, moderate, severe)       9/10 7. ITCH: Does it itch? If Yes, ask: How bad is the itch?      no 8. CAUSE: What do you think caused the swelling?     unknown 9 OTHER SYMPTOMS: Do you have any other symptoms? (e.g., fever)     fever  Protocols used: Skin Lump or Localized Swelling-A-AH

## 2024-09-15 ENCOUNTER — Ambulatory Visit (INDEPENDENT_AMBULATORY_CARE_PROVIDER_SITE_OTHER)

## 2024-09-15 ENCOUNTER — Ambulatory Visit: Payer: Self-pay

## 2024-09-15 ENCOUNTER — Ambulatory Visit

## 2024-09-15 VITALS — BP 134/85 | Temp 97.7°F | Resp 16 | Wt 227.8 lb

## 2024-09-15 DIAGNOSIS — M25511 Pain in right shoulder: Secondary | ICD-10-CM | POA: Diagnosis not present

## 2024-09-15 NOTE — Progress Notes (Signed)
 Right shoulder x-ray shows arthritis but no bony abnormalities that could be contributing to the cyst on your right shoulder. Like previously discussed, I recommend you present to the emergency department to have cyst drained.

## 2024-09-15 NOTE — Telephone Encounter (Signed)
 I have attempted without success to contact this patient by phone to return their call and I left a message on answering machine.

## 2024-09-15 NOTE — Progress Notes (Unsigned)
" ° ° ° °  Patient ID: Shelby Meza, female    DOB: [birth year]  MRN: [id number]  CC: Medical Management of Chronic Issues (Pain in both shoulders/10 Patient has a knot that has gotten bigger over time. Patient said that OTC medication do not help)   Subjective: Shelby Meza is a 84 y.o. female who presents to clinic for evaluation of right shoulder knot that has been steadily growing for the past month. Pt reports 10/10 right shoulder pain, has tried over the counter medication and has had minimal pain relief. Denies trauma, fall, or injury to shoulder. Reports it is limiting her arm mobility.    Allergies[1]  ROS: Review of Systems Negative except as stated above  PHYSICAL EXAM: BP 134/85   Temp 97.7 F (36.5 C) (Oral)   Resp 16   Wt 227 lb 12.8 oz (103.3 kg)   BMI 44.49 kg/m   Physical Exam  General: well-appearing, no acute distress Skin: round fluctuant mass on superior right shoulder, Cardiovascular: regular heart rate and rhythm, normal S1/S2, no murmurs, gallops, or rubs, peripheral pulses 2+ bilaterally Chest: no skeletal deformity, lungs clear to auscultation bilaterally, equal breath sounds bilaterally Musculoskeletal: right shoulder pain limiting PROM and AROM Extremities: no peripheral edema  ASSESSMENT AND PLAN:  1. Acute pain of right shoulder (Primary) - DG Shoulder Right; Future - Advised pt to present to emergency department for incision and drainage of subcutaneous cyst. Differential diagnosis    Patient was given the opportunity to ask questions.  Patient verbalized understanding of the plan and was able to repeat key elements of the plan.    No orders of the defined types were placed in this encounter.    Requested Prescriptions    No prescriptions requested or ordered in this encounter    No follow-ups on file.  Sula Cower Kirin Brandenburger, PA-C      [1]  Allergies Allergen Reactions   Chocolate Palpitations   "

## 2024-11-09 ENCOUNTER — Other Ambulatory Visit

## 2024-11-16 ENCOUNTER — Ambulatory Visit: Admitting: Physician Assistant
